# Patient Record
Sex: Female | Born: 1965
Health system: Southern US, Community
[De-identification: ages and names within clinical notes are randomized; demographics above are authoritative.]

## PROBLEM LIST (undated history)

## (undated) ENCOUNTER — Emergency Department (HOSPITAL_COMMUNITY): Payer: Medicaid Other

## (undated) DIAGNOSIS — I10 Essential (primary) hypertension: Secondary | ICD-10-CM

## (undated) DIAGNOSIS — H53131 Sudden visual loss, right eye: Secondary | ICD-10-CM

## (undated) HISTORY — DX: Sudden visual loss, right eye: H53.131

## (undated) HISTORY — PX: ABDOMINAL HYSTERECTOMY: SHX81

---

## 2018-10-29 ENCOUNTER — Encounter (HOSPITAL_COMMUNITY): Payer: Self-pay | Admitting: Emergency Medicine

## 2018-10-29 ENCOUNTER — Other Ambulatory Visit: Payer: Self-pay

## 2018-10-29 ENCOUNTER — Emergency Department (HOSPITAL_COMMUNITY): Payer: Self-pay

## 2018-10-29 ENCOUNTER — Emergency Department (HOSPITAL_COMMUNITY)
Admission: EM | Admit: 2018-10-29 | Discharge: 2018-10-29 | Disposition: A | Discharge status: Home / Self Care | Payer: Self-pay | Attending: Emergency Medicine | Admitting: Emergency Medicine

## 2018-10-29 DIAGNOSIS — Z7982 Long term (current) use of aspirin: Secondary | ICD-10-CM | POA: Insufficient documentation

## 2018-10-29 DIAGNOSIS — I160 Hypertensive urgency: Secondary | ICD-10-CM | POA: Insufficient documentation

## 2018-10-29 DIAGNOSIS — R51 Headache: Secondary | ICD-10-CM | POA: Insufficient documentation

## 2018-10-29 HISTORY — DX: Essential (primary) hypertension: I10

## 2018-10-29 LAB — BASIC METABOLIC PANEL
Anion gap: 9 (ref 5–15)
BUN: 14 mg/dL (ref 6–20)
CO2: 24 mmol/L (ref 22–32)
Calcium: 9 mg/dL (ref 8.9–10.3)
Chloride: 105 mmol/L (ref 98–111)
Creatinine, Ser: 0.6 mg/dL (ref 0.44–1.00)
GFR calc Af Amer: >60 mL/min (ref >60)
GFR calc non Af Amer: >60 mL/min (ref >60)
Glucose, Bld: 94 mg/dL (ref 70–99)
Potassium: 3.8 mmol/L (ref 3.5–5.1)
Sodium: 138 mmol/L (ref 135–145)

## 2018-10-29 LAB — CBC WITH DIFFERENTIAL/PLATELET
Abs Immature Granulocytes: 0.01 10*3/uL (ref 0.00–0.07)
Basophils Absolute: 0 10*3/uL (ref 0.0–0.1)
Basophils Relative: 1 %
Eosinophils Absolute: 0.1 10*3/uL (ref 0.0–0.5)
Eosinophils Relative: 2 %
HCT: 41.7 % (ref 36.0–46.0)
Hemoglobin: 13.8 g/dL (ref 12.0–15.0)
Immature Granulocytes: 0 %
Lymphocytes Relative: 30 %
Lymphs Abs: 1.4 10*3/uL (ref 0.7–4.0)
MCH: 29.2 pg (ref 26.0–34.0)
MCHC: 33.1 g/dL (ref 30.0–36.0)
MCV: 88.3 fL (ref 80.0–100.0)
Monocytes Absolute: 0.5 10*3/uL (ref 0.1–1.0)
Monocytes Relative: 12 %
Neutro Abs: 2.5 10*3/uL (ref 1.7–7.7)
Neutrophils Relative %: 55 %
Platelets: 368 10*3/uL (ref 150–400)
RBC: 4.72 MIL/uL (ref 3.87–5.11)
RDW: 12.4 % (ref 11.5–15.5)
WBC: 4.6 10*3/uL (ref 4.0–10.5)
nRBC: 0 % (ref 0.0–0.2)

## 2018-10-29 LAB — CBG MONITORING, ED: Glucose-Capillary: 95 mg/dL (ref 70–99)

## 2018-10-29 MED ORDER — LABETALOL HCL 5 MG/ML IV SOLN
10.0000 mg | Freq: Once | INTRAVENOUS | Status: AC
  Administered 2018-10-29: 10 mg via INTRAVENOUS

## 2018-10-29 MED ORDER — HYDROCHLOROTHIAZIDE 25 MG PO TABS: 25.0000 mg | ORAL_TABLET | Freq: Every day | ORAL | 0 refills | Status: DC

## 2018-10-29 MED ORDER — LABETALOL HCL 5 MG/ML IV SOLN
10.0000 mg | Freq: Once | INTRAVENOUS | Status: AC
  Administered 2018-10-29: 10 mg via INTRAVENOUS
  Filled 2018-10-29: qty 4

## 2018-10-29 NOTE — ED Provider Notes (Signed)
[facility] DEPT Provider Note   CSN: [id number] Arrival date & time: 10/29/18  1127    History   Chief Complaint Chief Complaint  Patient presents with  . Hypertension    HPI Marie Ferguson is a 53 y.o. female.     HPI   Pt is a 52 y/o female with a h/o HTN (untreated for many years) who presents to the ED today for eval of HTN. States she was seen at the eye doctor 6/24 for eval of decreased vision in the right eye. States that she in unsure how long she has had difficulty with vision in the right eye but estimates that it has been many years. She does note that it seemed to get worse and she has had no vision in the right eye since around March. States that at the eye doctor she was told that she "had popped a blood vessel" in her eye. States she was told it was likely chronic and that there was no intervention that could be done. She was also told that her BP was 198/130 and was advised to go to the ED. She has been unable to do so until today.   Reports being tx for HTN many years ago but does not have insurance and thus has not continued to f/u with a pcp.  Reports chronic daily headaches for at least the last month. Denies any current dizziness, lightheadedness, speech problems, facial droop, difficulty with word finding.  Denies current unilateral numbness/weakness. She has had a few episodes of tingling to the LUE but states that she thinks is due to "sleeping wrong" because it has only happened when she wakes up and goes away throughout the day. Last episode occurred 6/24.  Denies chest pain, shortness of breath, ble swelling. Reports urinary frequency and polydipsia.   Past Medical History:  Diagnosis Date  . Hypertension     There are no active problems to display for this patient.   Past Surgical History:  Procedure Laterality Date  . CESAREAN SECTION       OB History   No obstetric history on file.    Home Medications    Prior  to Admission medications   Medication Sig Start Date End Date Taking? Authorizing Provider  aspirin 325 MG EC tablet Take 650 mg by mouth every 6 (six) hours as needed for pain.   Yes [provider]  hydrochlorothiazide (HYDRODIURIL) 25 MG tablet Take 1 tablet (25 mg total) by mouth daily for 30 days. 10/29/18 11/28/18  Christalyn Goertz S, PA-C    Family History No family history on file.  Social History Social History   Tobacco Use  . Smoking status: Not on file  Substance Use Topics  . Alcohol use: Not on file  . Drug use: Not on file     Allergies   Patient has no known allergies.   Review of Systems Review of Systems  Constitutional: Negative for fever.  HENT: Negative for ear pain and sore throat.   Eyes: Positive for visual disturbance (chronic).  Respiratory: Negative for cough and shortness of breath.   Cardiovascular: Negative for chest pain and leg swelling.  Gastrointestinal: Negative for abdominal pain, constipation, diarrhea, nausea and vomiting.  Genitourinary: Negative for dysuria and hematuria.  Musculoskeletal: Negative for back pain.  Skin: Negative for rash.  Neurological: Positive for headaches. Negative for dizziness, weakness, light-headedness and numbness.  All other systems reviewed and are negative.    Physical Exam Updated  Vital Signs BP (!) 175/108   Pulse 64   Temp 98.7 F (37.1 C) (Oral)   Resp (!) 22   Ht 5\' 3"  (1.6 m)   Wt 84.8 kg   SpO2 99%   BMI 33.13 kg/m   Physical Exam Vitals signs and nursing note reviewed.  Constitutional:      General: She is not in acute distress.    Appearance: She is well-developed.  HENT:     Head: Normocephalic and atraumatic.  Eyes:     Extraocular Movements: Extraocular movements intact.     Conjunctiva/sclera: Conjunctivae normal.     Pupils: Pupils are equal, round, and reactive to light.     Comments: No nystagmus  Neck:     Musculoskeletal: Neck supple.  Cardiovascular:      Rate and Rhythm: Normal rate and regular rhythm.     Pulses: Normal pulses.     Heart sounds: Normal heart sounds. No murmur.  Pulmonary:     Effort: Pulmonary effort is normal. No respiratory distress.     Breath sounds: Normal breath sounds. No wheezing, rhonchi or rales.  Abdominal:     Palpations: Abdomen is soft.     Tenderness: There is no abdominal tenderness.  Musculoskeletal:        General: No tenderness.     Right lower leg: No edema.     Left lower leg: No edema.  Skin:    General: Skin is warm and dry.  Neurological:     Mental Status: She is alert.     Comments: Mental Status:  Alert, thought content appropriate, able to give a coherent history. Speech fluent without evidence of aphasia. Able to follow 2 step commands without difficulty.  Cranial Nerves:  II:  pupils equal, round, reactive to light III,IV, VI: ptosis not present, extra-ocular motions intact bilaterally  V,VII: smile symmetric, facial light touch sensation equal VIII: hearing grossly normal to voice  X: uvula elevates symmetrically  XI: bilateral shoulder shrug symmetric and strong XII: midline tongue extension without fassiculations Motor:  Normal tone. 5/5 strength of BUE and BLE major muscle groups including strong and equal grip strength and dorsiflexion/plantar flexion Sensory: light touch normal in all extremities. Gait: normal gait and balance. CV: 2+ radial and DP pulses      ED Treatments / Results  Labs (all labs ordered are listed, but only abnormal results are displayed) Labs Reviewed  CBC WITH DIFFERENTIAL/PLATELET  BASIC METABOLIC PANEL  CBG MONITORING, ED    EKG EKG Interpretation  Date/Time:  Saturday October 29 2018 12:08:37 EDT Ventricular Rate:  75 PR Interval:    QRS Duration: 82 QT Interval:  399 QTC Calculation: 446 R Axis:   26 Text Interpretation:  Sinus rhythm Left atrial enlargement Left ventricular hypertrophy Baseline wander in lead(s) II aVR aVF Abnormal  ECG Confirmed by Carmin Muskrat [phone]) on 10/29/2018 4:52:08 PM   Radiology Ct Head Wo Contrast  Result Date: 10/29/2018 CLINICAL DATA:  Headache and hypertension.  Right eye blurred vision EXAM: CT HEAD WITHOUT CONTRAST TECHNIQUE: Contiguous axial images were obtained from the base of the skull through the vertex without intravenous contrast. COMPARISON:  None. FINDINGS: Brain: Ventricles are normal in size and configuration. There is no intracranial mass, hemorrhage, extra-axial fluid collection, or midline shift. There is mild small vessel disease in the centra semiovale bilaterally. Elsewhere brain parenchyma appears unremarkable. No acute infarct evident. Vascular: No hyperdense vessel. There are foci of calcification in each carotid siphon region. Skull:  Bony calvarium appears intact. Sinuses/Orbits: Visualized paranasal sinuses are clear. Visualized orbits appear symmetric bilaterally. Other: Visualized mastoid air cells are clear. IMPRESSION: Mild periventricular small vessel disease. No acute infarct evident. No mass or hemorrhage. There are foci of arterial vascular calcification. Electronically Signed   By: Lowella Grip III M.D.   On: 10/29/2018 12:41    Procedures Procedures (including critical care time)  Medications Ordered in ED Medications  labetalol (NORMODYNE) injection 10 mg (10 mg Intravenous Given 10/29/18 1300)  labetalol (NORMODYNE) injection 10 mg (10 mg Intravenous Given 10/29/18 1512)     Initial Impression / Assessment and Plan / ED Course  I have reviewed the triage vital signs and the nursing notes.  Pertinent labs & imaging results that were available during my care of the patient were reviewed by me and considered in my medical decision making (see chart for details).     Final Clinical Impressions(s) / ED Diagnoses   Final diagnoses:  Hypertensive urgency   Pt is a 53 y/o female presenting for eval of HTN. Has h/o of same, untreated for many years.  Had recent visit to eye doctor for decreased vision in the right eye that has been an issue for many years. 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 Suspect old based on pt hx. No chest pain, sob, le swelling, or decreased urine output.   CBG normal CBC nonacute BMP nonacute   EKG Sinus rhythm Left atrial enlargement Left ventricular hypertrophy Baseline wander in lead(s) II aVR aVF Abnormal ECG    CT head with mild periventricular small vessel disease. No acute infarct evident. No mass or hemorrhage. There are foci of arterial vascular calcification.  Patient given IV labetalol in the ED.  Blood pressure improved to the 160s on my final reevaluation.  She is currently asymptomatic.  She has no signs of HTN emergency in the ED. Discussed work-up results and plan for follow-up with [facility] wellness.  Case management was consulted and have attempted to obtain appointment for her.  We will start her on hydrochlorothiazide.  She is in agreement to follow-up closely.  Advised to return to the ER for new or worsening symptoms.  She voices understanding of the plan and reasons to return.  All questions answered.  Patient stable for discharge.  ED Discharge Orders         Ordered    hydrochlorothiazide (HYDRODIURIL) 25 MG tablet  Daily     10/29/18 1619           Rodney Booze, PA-C 10/29/18 1710    Isla Pence, MD 10/30/18 [phone]

## 2018-10-29 NOTE — Progress Notes (Signed)
CSW acknowledging consult for establishing primary care follow up for this patient.  CSW spoke with patient, who reports she is not established with a primary care provider. CSW asked if patient would be agreeable to follow up with [facility] and briefly explained this services available.   Unfortunately, CSW is not able to schedule a primary care appointment for the patient, as it is the weekend. This was communicated to the patient, patient understands and is agreeable to call Monday during business hours to schedule a new patient appointment. Patient has no additional questions or concerns for CSW at this time.   CSW signing off. Please reconsult if additional social work needs arise.  Stephanie Acre, [facility] Social Worker [phone]

## 2018-10-29 NOTE — ED Notes (Signed)
Bed: WA07 Expected date: 10/29/18 Expected time: 12:53 AM Means of arrival:  Comments:

## 2018-10-29 NOTE — Discharge Instructions (Addendum)
You were given a prescription for hydrochlorothiazide.  Please take as directed.  This medication will make you urinate more frequently than normal.  You were given information to follow-up with a  and wellness clinic.  Please call the office to make an appointment soon as possible for reevaluation.  Return to the emergency department for any new or worsening symptoms including any severe headaches, vision changes, numbness/weakness, chest pain or shortness of breath.

## 2018-10-29 NOTE — ED Triage Notes (Signed)
Patient c/o constant headache and hypertension. States seen at eye doctor x4 days ago and told "blood vessel popped in right eye." States she cannot see out of right eye. Denies taking BP meds at this time.

## 2018-11-07 ENCOUNTER — Other Ambulatory Visit: Payer: Self-pay

## 2018-11-07 ENCOUNTER — Encounter: Payer: Self-pay | Admitting: Internal Medicine

## 2018-11-07 ENCOUNTER — Ambulatory Visit: Payer: Self-pay | Attending: Internal Medicine | Admitting: Internal Medicine

## 2018-11-07 VITALS — BP 155/90 | HR 89 | Temp 98.2°F | Resp 18 | Ht 60.0 in | Wt 185.0 lb

## 2018-11-07 DIAGNOSIS — H47211 Primary optic atrophy, right eye: Secondary | ICD-10-CM

## 2018-11-07 DIAGNOSIS — F17210 Nicotine dependence, cigarettes, uncomplicated: Secondary | ICD-10-CM

## 2018-11-07 DIAGNOSIS — N644 Mastodynia: Secondary | ICD-10-CM

## 2018-11-07 DIAGNOSIS — E669 Obesity, unspecified: Secondary | ICD-10-CM

## 2018-11-07 DIAGNOSIS — F17200 Nicotine dependence, unspecified, uncomplicated: Secondary | ICD-10-CM | POA: Insufficient documentation

## 2018-11-07 DIAGNOSIS — I10 Essential (primary) hypertension: Secondary | ICD-10-CM

## 2018-11-07 DIAGNOSIS — N63 Unspecified lump in unspecified breast: Secondary | ICD-10-CM | POA: Insufficient documentation

## 2018-11-07 DIAGNOSIS — H35033 Hypertensive retinopathy, bilateral: Secondary | ICD-10-CM

## 2018-11-07 MED ORDER — HYDROCHLOROTHIAZIDE 25 MG PO TABS: 25.0000 mg | ORAL_TABLET | Freq: Every day | ORAL | 3 refills | Status: DC

## 2018-11-07 MED ORDER — AMLODIPINE BESYLATE 5 MG PO TABS: 5.0000 mg | ORAL_TABLET | Freq: Every day | ORAL | 3 refills | Status: DC

## 2018-11-07 MED FILL — AMLODIPINE BESYLATE 5 MG TA: 5 | 30 days supply | Qty: 30 | Fill #0

## 2018-11-07 MED FILL — HYDROCHLOROTHIAZIDE 25 MG T: 25 | 30 days supply | Qty: 30 | Fill #0

## 2018-11-07 NOTE — Patient Instructions (Signed)
Follow a Healthy Eating Plan - You can do it! Limit sugary drinks.  Avoid sodas, sweet tea, sport or energy drinks, or fruit drinks.  Drink water, lo-fat milk, or diet drinks. Limit snack foods.   Cut back on candy, cake, cookies, chips, ice cream.  These are a special treat, only in small amounts. Eat plenty of vegetables.  Especially dark green, red, and orange vegetables. Aim for at least 3 servings a day. More is better! Include fruit in your daily diet.  Whole fruit is much healthier than fruit juice! Limit "white" bread, "white" pasta, "white" rice.   Choose "100% whole grain" products, brown or wild rice. Avoid fatty meats. Try "Meatless Monday" and choose eggs or beans one day a week.  When eating meat, choose lean meats like chicken, [place], and fish.  Grill, broil, or bake meats instead of frying, and eat poultry without the skin. Eat less salt.  Avoid frozen pizzas, frozen dinners and salty foods.  Use seasonings other than salt in cooking.  This can help blood pressure and keep you from swelling Beer, wine and liquor have calories.  If you can safely drink alcohol, limit to 1 drink per day for women, 2 drinks for men  Start Amlodipine and Hydrochlorothiazide for blood pressure.

## 2018-11-07 NOTE — Progress Notes (Signed)
Patient ID: Marie Ferguson, female    DOB: 1965-07-10  MRN: [id number]  CC: Hospitalization Follow-up (HTN)   Subjective: Marie Ferguson is a 53 y.o. female who presents for new pt visit Her concerns today include:  HTN, hypertensive retinopathy, obesity, tobacco dependence  Lived in [place] 7 yrs.  No PCP due to lack of insurance  Patient referred here after being seen through the ER on 10/29/2018 with elevated blood pressure and headaches.  CT of head showed mild periventricular small vessel disease with no acute infarct, mass or hemorrhage.  She was discharged on HCTZ which she has been taking but has not taken as yet for today.   Patient was referred to the ER from the ophthalmologist Dr. Schuyler Amor whom she had seen a few days prior for vision loss in the right eye.  Patient found to have hypertensive retinopathy bilaterally and atrophy of the optic nerve on the right.  She was also found to be hypertensive.  Hx of HTN but off meds x about 10 yrs  HTN: Patient was having daily HA x 2-3 mths.  Was taking ASA. 2-3 tabs/daily.  Headaches were frontal and associated with some blurred vision. No N/V.  HA dec since being on  HCTZ.   No cp/LE edema/SOB  Patient is obese.  She admits that she eats out a lot because she lives alone and does not do much cooking.  She is not very active.  She works at [employer] on the night shift.  Since her recent diagnosis of vision loss in the right eye, she is taking a 12 weeks leave from her job..   Tob dep:  "Smokes once in a blue moon."  C/o soreness in LT breast x few wks.   No dischg No fhx breast CA Over due for MMG  Family, social, past surgical history is reviewed and updated  Current Outpatient Medications on File Prior to Visit  Medication Sig Dispense Refill  . aspirin 325 MG EC tablet Take 650 mg by mouth every 6 (six) hours as needed for pain.     No current facility-administered medications on file prior to visit.     No Known Allergies   Social History   Socioeconomic History  . Marital status: Single    Spouse name: Not on file  . Number of children: 3  . Years of education: 1 yr community college  . Highest education level: Not on file  Occupational History  . Not on file  Social Needs  . Financial resource strain: Not on file  . Food insecurity    Worry: Not on file    Inability: Not on file  . Transportation needs    Medical: Not on file    Non-medical: Not on file  Tobacco Use  . Smoking status: Current Some Day Smoker    Packs/day: 0.25    Types: Cigarettes  . Smokeless tobacco: Never Used  Substance and Sexual Activity  . Alcohol use: Not Currently  . Drug use: Not Currently  . Sexual activity: Not Currently  Lifestyle  . Physical activity    Days per week: Not on file    Minutes per session: Not on file  . Stress: Not on file  Relationships  . Social Herbalist on phone: Not on file    Gets together: Not on file    Attends religious service: Not on file    Active member of club or organization: Not on file  Attends meetings of clubs or organizations: Not on file    Relationship status: Not on file  . Intimate partner violence    Fear of current or ex partner: Not on file    Emotionally abused: Not on file    Physically abused: Not on file    Forced sexual activity: Not on file  Other Topics Concern  . Not on file  Social History Narrative  . Not on file    Family History  Problem Relation Age of Onset  . Hypertension Mother   . Hypertension Father     Past Surgical History:  Procedure Laterality Date  . CESAREAN SECTION      ROS: Review of Systems Negative except as stated above  PHYSICAL EXAM: BP (!) 155/90 (BP Location: Left Arm, Patient Position: Sitting, Cuff Size: Large)   Pulse 89   Temp 98.2 F (36.8 C) (Oral)   Resp 18   Ht 5' (1.524 m)   Wt 185 lb (83.9 kg)   SpO2 98%   BMI 36.13 kg/m   Physical Exam  General appearance - alert, well  appearing, obese African-American female and in no distress Mental status - normal mood, behavior, speech, dress, motor activity, and thought processes Eyes -pinpoint pupils.  EOMI Nose - normal and patent, no erythema, discharge or polyps Mouth - mucous membranes moist, pharynx normal without lesions.  Several cavities noted including fillings that it fell out Neck - supple, no significant adenopathy Lymphatics -no cervical or axillary lymphadenopathy Chest - clear to auscultation, no wheezes, rales or rhonchi, symmetric air entry Heart - normal rate, regular rhythm, normal S1, S2, no murmurs, rubs, clicks or gallops Breasts -no dimpling seen of the skin.  About a 2 cm soft moderately tender mass felt in the left breast lateral and slightly inferior to the nipple.  No abnormal masses felt in the right breast Extremities - peripheral pulses normal, no pedal edema, no clubbing or cyanosis  CMP Latest Ref Rng & Units 10/29/2018  Glucose 70 - 99 mg/dL 94  BUN 6 - 20 mg/dL 14  Creatinine 0.44 - 1.00 mg/dL 0.60  Sodium 135 - 145 mmol/L 138  Potassium 3.5 - 5.1 mmol/L 3.8  Chloride 98 - 111 mmol/L 105  CO2 22 - 32 mmol/L 24  Calcium 8.9 - 10.3 mg/dL 9.0   Lipid Panel  No results found for: CHOL, TRIG, HDL, CHOLHDL, VLDL, LDLCALC, LDLDIRECT  CBC    Component Value Date/Time   WBC 4.6 10/29/2018 1253   RBC 4.72 10/29/2018 1253   HGB 13.8 10/29/2018 1253   HCT 41.7 10/29/2018 1253   PLT 368 10/29/2018 1253   MCV 88.3 10/29/2018 1253   MCH 29.2 10/29/2018 1253   MCHC 33.1 10/29/2018 1253   RDW 12.4 10/29/2018 1253   LYMPHSABS 1.4 10/29/2018 1253   MONOABS 0.5 10/29/2018 1253   EOSABS 0.1 10/29/2018 1253   BASOSABS 0.0 10/29/2018 1253    ASSESSMENT AND PLAN: 1. Essential hypertension DASH diet discussed and encouraged. Blood pressure goal is 130/80 or lower.  She has not taken HCTZ as yet for today.  Continue HCTZ and add low-dose amlodipine. - Comprehensive metabolic panel -  Lipid panel - amLODipine (NORVASC) 5 MG tablet; Take 1 tablet (5 mg total) by mouth daily.  Dispense: 30 tablet; Refill: 3 - hydrochlorothiazide (HYDRODIURIL) 25 MG tablet; Take 1 tablet (25 mg total) by mouth daily.  Dispense: 30 tablet; Refill: 3  2. Tobacco dependence Patient advised to quit smoking.  She  is a light smoker. Discussed health risks associated with smoking including lung and other types of cancers, chronic lung diseases and CV risks.. Pt ready to give trail of quitting.  Discussed methods to help quit including quitting cold [place], use of NRT, Chantix and Bupropion.  Patient feels she can quit on her own  3. Obesity (BMI 30-39.9) Dietary counseling given.  Advised to eliminate sugary drinks from the diet.  Advised to cut back on white carbohydrates, red meat.  Printed information also provided.  Encouraged her to get in some form of regular aerobic exercise at least 3 to 4 days a week for 30 to 40 minutes.  Initially she can start a trying to do 10 to 15 minutes.  4. Breast mass in female - MM Digital Diagnostic Unilat R; Future  5. Mastalgia See #4 above  6. 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  We will work on getting blood pressure better.  At least 30 minutes spent with patient in direct face-to-face evaluation discussing diagnosis, management and coordinating care Patient advised to apply for the orange card/cone discount so that we can refer her to the dentist on a follow-up visit Patient was given the opportunity to ask questions.  Patient verbalized understanding of the plan and was able to repeat key elements of the plan.   Orders Placed This Encounter  Procedures  . MM Digital Diagnostic Unilat R  . Comprehensive metabolic panel  . Lipid panel     Requested Prescriptions   Signed Prescriptions Disp Refills  . amLODipine (NORVASC) 5 MG tablet 30 tablet 3    Sig: Take 1 tablet (5 mg  total) by mouth daily.  . hydrochlorothiazide (HYDRODIURIL) 25 MG tablet 30 tablet 3    Sig: Take 1 tablet (25 mg total) by mouth daily.    Return in about 1 month (around 12/08/2018) for PAP.  Karle Plumber, MD, FACP

## 2018-11-08 ENCOUNTER — Telehealth: Payer: Self-pay | Admitting: *Deleted

## 2018-11-08 LAB — LIPID PANEL
Chol/HDL Ratio: 4.2 ratio (ref 0.0–4.4)
Cholesterol, Total: 216 mg/dL — ABNORMAL HIGH (ref 100–199)
HDL: 52 mg/dL (ref >39)
LDL Calculated: 134 mg/dL — ABNORMAL HIGH (ref 0–99)
Triglycerides: 152 mg/dL — ABNORMAL HIGH (ref 0–149)
VLDL Cholesterol Cal: 30 mg/dL (ref 5–40)

## 2018-11-08 LAB — COMPREHENSIVE METABOLIC PANEL
ALT: 18 IU/L (ref 0–32)
AST: 18 IU/L (ref 0–40)
Albumin/Globulin Ratio: 1.2 (ref 1.2–2.2)
Albumin: 4.4 g/dL (ref 3.8–4.9)
Alkaline Phosphatase: 134 IU/L — ABNORMAL HIGH (ref 39–117)
BUN/Creatinine Ratio: 22 (ref 9–23)
BUN: 20 mg/dL (ref 6–24)
Bilirubin Total: 0.3 mg/dL (ref 0.0–1.2)
CO2: 27 mmol/L (ref 20–29)
Calcium: 9.5 mg/dL (ref 8.7–10.2)
Chloride: 97 mmol/L (ref 96–106)
Creatinine, Ser: 0.91 mg/dL (ref 0.57–1.00)
GFR calc Af Amer: 83 mL/min/{1.73_m2} (ref >59)
GFR calc non Af Amer: 72 mL/min/{1.73_m2} (ref >59)
Globulin, Total: 3.7 g/dL (ref 1.5–4.5)
Glucose: 89 mg/dL (ref 65–99)
Potassium: 4.8 mmol/L (ref 3.5–5.2)
Sodium: 137 mmol/L (ref 134–144)
Total Protein: 8.1 g/dL (ref 6.0–8.5)

## 2018-11-08 NOTE — Telephone Encounter (Signed)
Patient verified DOB Patient is aware of kidney and liver function being okay and needing to address cholesterol level being elevated. Patient is aware of a recheck being completed in 3 months and possibly starting a medication if the level is still high. No further questions.

## 2018-11-08 NOTE — Telephone Encounter (Signed)
-----   Message from Ladell Pier, MD sent at 11/08/2018  9:18 AM EDT ----- Let patient know that her kidney and liver function tests are okay.  Her LDL cholesterol is 134 with goal being less than 70.  She can get this down through healthy eating and regular exercise as we discussed yesterday.  We can plan to recheck in about 3 months.  If still elevated, I would recommend starting a medication to help lower her cholesterol. The 10-year ASCVD risk score Mikey Bussing DC Brooke Bonito., et al., 2013) is: 19.3%   Values used to calculate the score:     Age: 53 years     Sex: Female     Is Non-Hispanic African American: Yes     Diabetic: No     Tobacco smoker: Yes     Systolic Blood Pressure: 128 mmHg     Is BP treated: Yes     HDL Cholesterol: 52 mg/dL     Total Cholesterol: 216 mg/dL

## 2018-11-09 ENCOUNTER — Telehealth: Payer: Self-pay | Admitting: General Practice

## 2018-11-09 NOTE — Telephone Encounter (Signed)
Medical Assistant left message on patient's home and cell voicemail. Voicemail states to give a call back to [place] with [facility] at [phone]. Patient is aware of MA speaking with [facility] program which is now through the cancer center. A new application will be mailed out.

## 2018-11-09 NOTE — Telephone Encounter (Signed)
Pt gets recording saying that due to covid-19 there not taking scholarship fund..please follow up

## 2018-12-08 ENCOUNTER — Encounter: Payer: Self-pay | Admitting: Internal Medicine

## 2018-12-08 ENCOUNTER — Other Ambulatory Visit: Payer: Self-pay

## 2018-12-08 ENCOUNTER — Ambulatory Visit: Payer: Self-pay | Attending: Internal Medicine | Admitting: Internal Medicine

## 2018-12-08 VITALS — BP 167/114 | HR 95 | Temp 98.7°F | Resp 16 | Wt 191.0 lb

## 2018-12-08 DIAGNOSIS — E669 Obesity, unspecified: Secondary | ICD-10-CM

## 2018-12-08 DIAGNOSIS — I10 Essential (primary) hypertension: Secondary | ICD-10-CM

## 2018-12-08 DIAGNOSIS — N63 Unspecified lump in unspecified breast: Secondary | ICD-10-CM

## 2018-12-08 DIAGNOSIS — E782 Mixed hyperlipidemia: Secondary | ICD-10-CM | POA: Insufficient documentation

## 2018-12-08 DIAGNOSIS — N644 Mastodynia: Secondary | ICD-10-CM

## 2018-12-08 MED FILL — ?HYDROCHLOROTHIAZIDE 25MG T: 25 | 30 days supply | Qty: 30 | Fill #1

## 2018-12-08 MED FILL — ?AMLODIPINE BESYLATE 5MG TA: 5 | 30 days supply | Qty: 30 | Fill #1

## 2018-12-08 NOTE — Progress Notes (Signed)
Pt states she had a full hysterectomy

## 2018-12-08 NOTE — Patient Instructions (Signed)
Please give patient an appointment with the clinical pharmacist in 2 weeks for repeat blood pressure check.   Your blood pressure is not at goal.  Goal is 130/80 or lower.  Make sure that you are taking your medicines every day.  Try to purchase a blood pressure monitoring device so that you can check your blood pressure at home.  If you do get one, I recommend checking your blood pressure at least twice a week.  The goal is 130/80 or lower.

## 2018-12-08 NOTE — Progress Notes (Signed)
Patient ID: Marie Ferguson, female    DOB: [birth year]  MRN: [id number]  CC: Gynecologic Exam   Subjective: Marie Ferguson is a 53 y.o. female who presents for follow-up visit for Pap and recheck blood pressure Her concerns today include:  HTN, hypertensive retinopathy BL with optic disc atrophy on RT (Dr. Schuyler Amor), obesity, tob dep  This visit was for a Pap smear.  However patient reports that she had a total hysterectomy in 1991.  She is not sure why she had hysterectomy No vaginal bleeding or discgh. On last visit she c/o soreness in LT breast.  Referred for MMG but the scholarship program no longer offered.  Would have to get BCCP application today  HYPERTENSION Currently taking: see medication list.  She is on HCTZ and amlodipine.  She has not taken medicines as yet for today. Med Adherence: [x]  Yes but did not take as yet for he morning    Medication side effects: []  Yes    [x]  No Adherence with salt restriction: [x]  Yes    []  No Home Monitoring?: []  Yes    []  No Monitoring Frequency: []  Yes    [x]  No Home BP results range: []  Yes    []  No SOB? []  Yes    [x]  No Chest Pain?: []  Yes    [x]  No Leg swelling?: []  Yes    [x]  No Headaches?: []  Yes    [x]  No Dizziness? []  Yes    [x]  No Comments:    Obesity: Reports that "I have cut back a lot on eating take out." LDL cholesterol was elevated on recent blood test.  I recommend she try to get this down through healthy eating habits and regular exercise.  Patient Active Problem List   Diagnosis Date Noted  . Hyperlipidemia, mixed 12/08/2018  . Hypertensive retinopathy of both eyes 11/07/2018  . Essential hypertension 11/07/2018  . Primary optic atrophy of right eye 11/07/2018  . Tobacco dependence 11/07/2018  . Obesity (BMI 30-39.9) 11/07/2018  . Breast mass in female 11/07/2018     Current Outpatient Medications on File Prior to Visit  Medication Sig Dispense Refill  . amLODipine (NORVASC) 5 MG tablet Take 1 tablet (5 mg total) by  mouth daily. 30 tablet 3  . aspirin 325 MG EC tablet Take 650 mg by mouth every 6 (six) hours as needed for pain.    . hydrochlorothiazide (HYDRODIURIL) 25 MG tablet Take 1 tablet (25 mg total) by mouth daily. 30 tablet 3   No current facility-administered medications on file prior to visit.     No Known Allergies  Social History   Socioeconomic History  . Marital status: Single    Spouse name: Not on file  . Number of children: 3  . Years of education: 1 yr community college  . Highest education level: Not on file  Occupational History  . Not on file  Social Needs  . Financial resource strain: Not on file  . Food insecurity    Worry: Not on file    Inability: Not on file  . Transportation needs    Medical: Not on file    Non-medical: Not on file  Tobacco Use  . Smoking status: Current Some Day Smoker    Packs/day: 0.25    Types: Cigarettes  . Smokeless tobacco: Never Used  Substance and Sexual Activity  . Alcohol use: Not Currently  . Drug use: Not Currently  . Sexual activity: Not Currently  Lifestyle  . Physical activity  Days per week: Not on file    Minutes per session: Not on file  . Stress: Not on file  Relationships  . Social Herbalist on phone: Not on file    Gets together: Not on file    Attends religious service: Not on file    Active member of club or organization: Not on file    Attends meetings of clubs or organizations: Not on file    Relationship status: Not on file  . Intimate partner violence    Fear of current or ex partner: Not on file    Emotionally abused: Not on file    Physically abused: Not on file    Forced sexual activity: Not on file  Other Topics Concern  . Not on file  Social History Narrative  . Not on file    Family History  Problem Relation Age of Onset  . Hypertension Mother   . Hypertension Father     Past Surgical History:  Procedure Laterality Date  . CESAREAN SECTION      ROS: Review of Systems  Negative except as stated above  PHYSICAL EXAM: BP (!) 167/114   Pulse 95   Temp 98.7 F (37.1 C) (Oral)   Resp 16   Wt 191 lb (86.6 kg)   SpO2 95%   BMI 37.30 kg/m   BP 160/120 Physical Exam General appearance - alert, well appearing, and in no distress Mental status - normal mood, behavior, speech, dress, motor activity, and thought processes Chest - clear to auscultation, no wheezes, rales or rhonchi, symmetric air entry Heart - normal rate, regular rhythm, normal S1, S2, no murmurs, rubs, clicks or gallops Extremities - peripheral pulses normal, no pedal edema, no clubbing or cyanosis Pelvic exam: My CMA Sallyanne Havers is present.  Patient does not have a cervix.  CMP Latest Ref Rng & Units 11/07/2018 10/29/2018  Glucose 65 - 99 mg/dL 89 94  BUN 6 - 24 mg/dL 20 14  Creatinine 0.57 - 1.00 mg/dL 0.91 0.60  Sodium 134 - 144 mmol/L 137 138  Potassium 3.5 - 5.2 mmol/L 4.8 3.8  Chloride 96 - 106 mmol/L 97 105  CO2 20 - 29 mmol/L 27 24  Calcium 8.7 - 10.2 mg/dL 9.5 9.0  Total Protein 6.0 - 8.5 g/dL 8.1 -  Total Bilirubin 0.0 - 1.2 mg/dL 0.3 -  Alkaline Phos 39 - 117 IU/L 134(H) -  AST 0 - 40 IU/L 18 -  ALT 0 - 32 IU/L 18 -   Lipid Panel     Component Value Date/Time   CHOL 216 (H) 11/07/2018 1333   TRIG 152 (H) 11/07/2018 1333   HDL 52 11/07/2018 1333   CHOLHDL 4.2 11/07/2018 1333   LDLCALC 134 (H) 11/07/2018 1333    CBC    Component Value Date/Time   WBC 4.6 10/29/2018 1253   RBC 4.72 10/29/2018 1253   HGB 13.8 10/29/2018 1253   HCT 41.7 10/29/2018 1253   PLT 368 10/29/2018 1253   MCV 88.3 10/29/2018 1253   MCH 29.2 10/29/2018 1253   MCHC 33.1 10/29/2018 1253   RDW 12.4 10/29/2018 1253   LYMPHSABS 1.4 10/29/2018 1253   MONOABS 0.5 10/29/2018 1253   EOSABS 0.1 10/29/2018 1253   BASOSABS 0.0 10/29/2018 1253    ASSESSMENT AND PLAN: 1. Essential hypertension Not at goal.  Patient to take her medicines when she returns home.  Follow-up with clinical pharmacist in 2  weeks for repeat blood pressure check.  Advised to take medicines before coming to that visit.  Continue to limit salt in the foods.  2. Obesity (BMI 30-39.9) Commended her on cutting back on portion sizes.  Dietary counseling given.  Encouraged her to get in some form of moderate intensity exercise at least 3 to 4 days a week for 30 minutes  3. Mastalgia 4. Breast mass in female My CMA gave her the form for BCCP so that she can apply and get her mammogram done.  5.  Hyperlipidemia Patient to work on improving eating habits and getting in some regular exercise.  We will plan to recheck cholesterol again in several months  Patient was given the opportunity to ask questions.  Patient verbalized understanding of the plan and was able to repeat key elements of the plan.   No orders of the defined types were placed in this encounter.    Requested Prescriptions    No prescriptions requested or ordered in this encounter    Return in about 3 months (around 03/10/2019).  Karle Plumber, MD, FACP

## 2018-12-19 ENCOUNTER — Telehealth (HOSPITAL_COMMUNITY): Payer: Self-pay

## 2018-12-19 NOTE — Telephone Encounter (Signed)
Telephoned patient to schedule appointment with [facility]. Left message with info for patient to return call.

## 2018-12-20 ENCOUNTER — Other Ambulatory Visit (HOSPITAL_COMMUNITY): Payer: Self-pay | Admitting: *Deleted

## 2018-12-20 DIAGNOSIS — N644 Mastodynia: Secondary | ICD-10-CM

## 2018-12-22 ENCOUNTER — Ambulatory Visit: Payer: Self-pay | Attending: Family Medicine | Admitting: Pharmacist

## 2018-12-22 ENCOUNTER — Encounter: Payer: Self-pay | Admitting: Pharmacist

## 2018-12-22 ENCOUNTER — Other Ambulatory Visit: Payer: Self-pay

## 2018-12-22 VITALS — BP 142/82 | HR 78

## 2018-12-22 DIAGNOSIS — I10 Essential (primary) hypertension: Secondary | ICD-10-CM

## 2018-12-22 NOTE — Progress Notes (Signed)
   S: PCP: Dr. Wynetta Emery     Patient arrives in good spirits. Presents to the clinic for hypertension evaluation, counseling, and management.  Patient was referred and last seen by Primary Care Provider on 12/08/18 - BP was 167/114.   Patient reports adherence with medications. Patent reports occassional headache (2x/week).  Current BP Medications include:  Amlodipine 5 mg daily, HCTZ 25 mg daily (did not take this AM as instructed)  Dietary habits include: non-compliant with salt restriction. Denies caffeine intake. Exercise habits include:Denies Family / Social history: - HTN (mother, father) - Tobacco: 0.25 PPD - Denies alcohol intake since last PCP encounter  O:   Home BP readings: does not have a machine to check, will purchase one today.  Last 3 Office BP readings: BP Readings from Last 3 Encounters:  12/22/18 (!) 142/82  12/08/18 (!) 167/114  11/07/18 (!) 155/90   BMET    Component Value Date/Time   NA 137 11/07/2018 1333   K 4.8 11/07/2018 1333   CL 97 11/07/2018 1333   CO2 27 11/07/2018 1333   GLUCOSE 89 11/07/2018 1333   GLUCOSE 94 10/29/2018 1253   BUN 20 11/07/2018 1333   CREATININE 0.91 11/07/2018 1333   CALCIUM 9.5 11/07/2018 1333   GFRNONAA 72 11/07/2018 1333   GFRAA 83 11/07/2018 1333   Renal function: CrCl cannot be calculated (Patient's most recent lab result is older than the maximum 21 days allowed.).  Clinical ASCVD: No  The 10-year ASCVD risk score Mikey Bussing DC Jr., et al., 2013) is: 14.5%   Values used to calculate the score:     Age: 53 years     Sex: Female     Is Non-Hispanic African American: Yes     Diabetic: No     Tobacco smoker: Yes     Systolic Blood Pressure: 458 mmHg     Is BP treated: Yes     HDL Cholesterol: 52 mg/dL     Total Cholesterol: 216 mg/dL   A/P: Hypertension longstanding currently uncontrolled on current medications. BP Goal = <130/80 mmHg. Patient is adherent with current medications but did not take this AM. She is  going to obtain a BP cuff for home use at the conclusion of this appointment. Will have her check home BP and follow-up with me in 1 week. I have emphasized that she should take her medications before seeing me next week. Will make no changes at this time.  -Continued current regimen. -Counseled on lifestyle modifications for blood pressure control including reduced dietary sodium, increased exercise, adequate sleep  Results reviewed and written information provided.Total time in face-to-face counseling 15 minutes.   F/U Clinic Visit in 1 week.   Benard Halsted, PharmD, [place] [phone]

## 2018-12-22 NOTE — Patient Instructions (Signed)
Thank you for coming to see us today.   Blood pressure today is improving.  Continue taking blood pressure medications as prescribed.   Limiting salt and caffeine, as well as exercising as able for at least 30 minutes for 5 days out of the week, can also help you lower your blood pressure.  Take your blood pressure at home if you are able. Please write down these numbers and bring them to your visits.  If you have any questions about medications, please call me [phone].  Luke  

## 2018-12-29 ENCOUNTER — Other Ambulatory Visit (HOSPITAL_COMMUNITY): Payer: Self-pay | Admitting: Obstetrics and Gynecology

## 2018-12-29 ENCOUNTER — Ambulatory Visit
Admission: RE | Admit: 2018-12-29 | Discharge: 2018-12-29 | Disposition: A | Discharge status: Home / Self Care | Payer: No Typology Code available for payment source | Source: Ambulatory Visit | Attending: Obstetrics and Gynecology | Admitting: Obstetrics and Gynecology

## 2018-12-29 ENCOUNTER — Encounter (HOSPITAL_COMMUNITY): Payer: Self-pay

## 2018-12-29 ENCOUNTER — Other Ambulatory Visit: Payer: Self-pay

## 2018-12-29 ENCOUNTER — Encounter: Payer: Self-pay | Admitting: Pharmacist

## 2018-12-29 ENCOUNTER — Ambulatory Visit: Payer: Self-pay | Attending: Internal Medicine | Admitting: Pharmacist

## 2018-12-29 ENCOUNTER — Ambulatory Visit (HOSPITAL_COMMUNITY)
Admission: RE | Admit: 2018-12-29 | Discharge: 2018-12-29 | Disposition: A | Discharge status: Home / Self Care | Payer: Self-pay | Source: Ambulatory Visit | Attending: Obstetrics and Gynecology | Admitting: Obstetrics and Gynecology

## 2018-12-29 VITALS — BP 128/82 | HR 99

## 2018-12-29 DIAGNOSIS — N6320 Unspecified lump in the left breast, unspecified quadrant: Secondary | ICD-10-CM

## 2018-12-29 DIAGNOSIS — N644 Mastodynia: Secondary | ICD-10-CM

## 2018-12-29 DIAGNOSIS — I10 Essential (primary) hypertension: Secondary | ICD-10-CM

## 2018-12-29 DIAGNOSIS — R921 Mammographic calcification found on diagnostic imaging of breast: Secondary | ICD-10-CM

## 2018-12-29 DIAGNOSIS — Z1239 Encounter for other screening for malignant neoplasm of breast: Secondary | ICD-10-CM | POA: Insufficient documentation

## 2018-12-29 MED ORDER — AMLODIPINE BESYLATE 10 MG PO TABS: 10.0000 mg | ORAL_TABLET | Freq: Every day | ORAL | 2 refills | Status: DC

## 2018-12-29 MED FILL — ?AMLODIPINE BESYLATE 10 MG: 10 | 30 days supply | Qty: 30 | Fill #0

## 2018-12-29 NOTE — Addendum Note (Signed)
Encounter addended by: Loletta Parish, RN on: 12/29/2018 7:50 PM  Actions taken: Clinical Note Signed

## 2018-12-29 NOTE — Progress Notes (Signed)
   S: PCP: Dr. Wynetta Emery     Patient arrives in good spirits. Presents to the clinic for hypertension evaluation, counseling, and management. Patient was referred and last seen by Primary Care Provider on 12/22/18 - BP was 142/82.   Patient reports adherence with medications. Patent reports occassional headache (2x/week).  Current BP Medications include:  Amlodipine 5 mg daily, HCTZ 25 mg daily (took both this AM)  Dietary habits include: replaced salt with Ms. Deliah Boston. Denies caffeine intake. Exercise habits include: denies Family / Social history: - HTN (mother, father) - Tobacco: 0.25 PPD - Denies alcohol intake since last PCP encounter  O:    Home BP readings: Checking and brings in log that reveals the following: Monday: 125/91 Tuesday: 155/103 Wed: 157/103 Thursday: 154/111 She reports not resting as instructed before taking home pressure  Last 3 Office BP readings: BP Readings from Last 3 Encounters:  12/29/18 (!) 148/88  12/29/18 128/82  12/22/18 (!) 142/82   BMET    Component Value Date/Time   NA 137 11/07/2018 1333   K 4.8 11/07/2018 1333   CL 97 11/07/2018 1333   CO2 27 11/07/2018 1333   GLUCOSE 89 11/07/2018 1333   GLUCOSE 94 10/29/2018 1253   BUN 20 11/07/2018 1333   CREATININE 0.91 11/07/2018 1333   CALCIUM 9.5 11/07/2018 1333   GFRNONAA 72 11/07/2018 1333   GFRAA 83 11/07/2018 1333   Renal function: CrCl cannot be calculated (Patient's most recent lab result is older than the maximum 21 days allowed.).  Clinical ASCVD: No  The 10-year ASCVD risk score Mikey Bussing DC Jr., et al., 2013) is: 16.6%   Values used to calculate the score:     Age: 53 years     Sex: Female     Is Non-Hispanic African American: Yes     Diabetic: No     Tobacco smoker: Yes     Systolic Blood Pressure: 123456 mmHg     Is BP treated: Yes     HDL Cholesterol: 52 mg/dL     Total Cholesterol: 216 mg/dL  A/P: Hypertension longstanding currently uncontrolled on current medications. BP  Goal = <130/80 mmHg. Patient is adherent with current medications and took medications this morning.  -Increase amlodipine to 10 mg daily -Counseled on lifestyle modifications for blood pressure control including reduced dietary sodium, increased exercise, adequate sleep -HM: Fluarix given; will address Adacel at next visit -ASCVD risk: score decrease with improved BP control. Will assess need for statin at follow-up visit.   Results reviewed and written information provided.Total time in face-to-face counseling 15 minutes.   F/U Clinic Visit in 1 month.   Benard Halsted, PharmD, [place] [phone]

## 2018-12-29 NOTE — Progress Notes (Addendum)
Complaints of left lower breast pain near nipple x 2 years. Patient states the pain comes and goes. Patient rates the pain at a 5-6 out of 10.  Pap Smear: Pap smear not completed today. Last Pap smear was over 10 years ago and normal per patient. Per patient has no history of an abnormal Pap smear. Patient has a history of a hysterectomy in 2012 that per patient is unsure why. Patient stated she did not have the hysterectomy due to cancer and was for benign reasons. Patient doesn't need any further Pap smears due to her history of a hysterectomy for benign reasons per BCCCP and ACOG guidelines. No Pap smear results are in Epic.  Physical exam: Breasts Breasts symmetrical. No skin abnormalities bilateral breasts. No nipple retraction bilateral breasts. No nipple discharge bilateral breasts. No lymphadenopathy. No lumps palpated bilateral breasts. Complaints of left lower breast pain at 6 o'clock on exam. Referred patient to the [facility] for a diagnostic mammogram. Appointment scheduled for Thursday, December 29, 2018 at 1350.       Pelvic/Bimanual No Pap smear completed today since patient has a history of a hysterectomy for benign reasons. Pap smear not indicated per BCCCP guidelines.   Smoking History: Patient is a current smoker. Discussed smoking cessation with patient. Referred to the [facility] Quitline and gave resources to the free smoking cessation classes at [facility].  Patient Navigation: Patient education provided. Access to services provided for patient through [facility] program. Uber/Lyft provided for patient to get to [place] appointment and home.  Colorectal Cancer Screening: Per patient has never had a colonoscopy completed. No complaints today.   Breast and Cervical Cancer Risk Assessment: Patient has no family history of breast cancer, known genetic mutations, or radiation treatment to the chest before age 33. Patient has no history of cervical dysplasia,  immunocompromised, or DES exposure in-utero.  Risk Assessment    Risk Scores      12/29/2018   Last edited by: Armond Hang, LPN   5-year risk: 1.3 %   Lifetime risk: 8 %

## 2018-12-29 NOTE — Patient Instructions (Signed)
Thank you for coming to see [place] today.   Blood pressure today is still above goal.  Start taking amlodipine 10 mg daily.  Limiting salt and caffeine, as well as exercising as able for at least 30 minutes for 5 days out of the week, can also help you lower your blood pressure.  Take your blood pressure at home if you are able. Please write down these numbers and bring them to your visits.  If you have any questions about medications, please call me [phone].  Lurena Joiner

## 2018-12-29 NOTE — Patient Instructions (Signed)
Explained breast self awareness with Richelle Ito. Patient did not need a Pap smear today due to patient has a history of a hysterectomy for benign reasons. Let patient know that she doesn't need any further Pap smears due to her history of a hysterectomy for benign reasons. Referred patient to the [facility] for a diagnostic mammogram. Appointment scheduled for Thursday, December 29, 2018 at 1350. Patient aware of appointment and will be there. Discussed smoking cessation with patient. Referred to the [facility] Quitline and gave resources to the free smoking cessation classes at [facility]. Richelle Ito verbalized understanding.  Herley Bernardini, Arvil Chaco, RN 12:18 PM

## 2019-01-05 ENCOUNTER — Ambulatory Visit
Admission: RE | Admit: 2019-01-05 | Discharge: 2019-01-05 | Disposition: A | Discharge status: Home / Self Care | Payer: No Typology Code available for payment source | Source: Ambulatory Visit | Attending: Obstetrics and Gynecology | Admitting: Obstetrics and Gynecology

## 2019-01-05 ENCOUNTER — Other Ambulatory Visit: Payer: Self-pay

## 2019-01-05 DIAGNOSIS — N6320 Unspecified lump in the left breast, unspecified quadrant: Secondary | ICD-10-CM

## 2019-01-05 DIAGNOSIS — R921 Mammographic calcification found on diagnostic imaging of breast: Secondary | ICD-10-CM

## 2019-01-06 ENCOUNTER — Other Ambulatory Visit: Payer: Self-pay | Admitting: Obstetrics and Gynecology

## 2019-01-06 DIAGNOSIS — R921 Mammographic calcification found on diagnostic imaging of breast: Secondary | ICD-10-CM

## 2019-01-13 ENCOUNTER — Other Ambulatory Visit: Payer: Self-pay | Admitting: General Practice

## 2019-01-13 ENCOUNTER — Ambulatory Visit
Admission: RE | Admit: 2019-01-13 | Discharge: 2019-01-13 | Disposition: A | Discharge status: Home / Self Care | Payer: No Typology Code available for payment source | Source: Ambulatory Visit | Attending: Obstetrics and Gynecology | Admitting: Obstetrics and Gynecology

## 2019-01-13 ENCOUNTER — Other Ambulatory Visit: Payer: Self-pay

## 2019-01-13 DIAGNOSIS — R921 Mammographic calcification found on diagnostic imaging of breast: Secondary | ICD-10-CM

## 2019-01-16 ENCOUNTER — Encounter: Payer: Self-pay | Admitting: *Deleted

## 2019-01-18 MED FILL — ?HYDROCHLOROTHIAZIDE 25MG T: 25 | 30 days supply | Qty: 30 | Fill #2

## 2019-01-20 ENCOUNTER — Ambulatory Visit: Payer: Self-pay | Admitting: General Surgery

## 2019-01-20 DIAGNOSIS — N6022 Fibroadenosis of left breast: Secondary | ICD-10-CM

## 2019-01-27 ENCOUNTER — Other Ambulatory Visit: Payer: Self-pay | Admitting: General Surgery

## 2019-01-27 DIAGNOSIS — N6022 Fibroadenosis of left breast: Secondary | ICD-10-CM

## 2019-01-30 ENCOUNTER — Ambulatory Visit: Payer: Self-pay | Admitting: Pharmacist

## 2019-02-02 MED FILL — ?AMLODIPINE BESYLATE 10 MG: 10 | 30 days supply | Qty: 30 | Fill #1

## 2019-02-03 ENCOUNTER — Other Ambulatory Visit: Payer: Self-pay

## 2019-02-03 ENCOUNTER — Ambulatory Visit: Payer: Self-pay | Attending: Internal Medicine | Admitting: Pharmacist

## 2019-02-03 ENCOUNTER — Encounter: Payer: Self-pay | Admitting: Pharmacist

## 2019-02-03 VITALS — BP 121/82 | HR 85

## 2019-02-03 DIAGNOSIS — I10 Essential (primary) hypertension: Secondary | ICD-10-CM

## 2019-02-03 NOTE — Patient Instructions (Signed)

## 2019-02-03 NOTE — Progress Notes (Signed)
   S: PCP: Dr. Wynetta Emery     Patient arrives in good spirits. Presents to the clinic for hypertension evaluation, counseling, and management. Patient was referred and last seen by Primary Care Provider on 12/22/18 - BP was 142/82. I saw her on 8/27 and her BP was 128/82.   Patient reports adherence with medications. Patent reports occassional headache (2x/week). She reports increased stress/anxiety from issues with her daughter and upcoming surgery.   Current BP Medications include:  Amlodipine 10 mg daily, HCTZ 25 mg daily (took both this AM)  Dietary habits include: replaced salt with Ms. Deliah Boston. Denies caffeine intake. Exercise habits include: denies Family / Social history: - HTN (mother, father) - Tobacco: 0.25 PPD - Denies alcohol intake since last PCP encounter  O:    Last 3 Office BP readings: BP Readings from Last 3 Encounters:  02/03/19 121/82  12/29/18 (!) 148/88  12/29/18 128/82   BMET    Component Value Date/Time   NA 137 11/07/2018 1333   K 4.8 11/07/2018 1333   CL 97 11/07/2018 1333   CO2 27 11/07/2018 1333   GLUCOSE 89 11/07/2018 1333   GLUCOSE 94 10/29/2018 1253   BUN 20 11/07/2018 1333   CREATININE 0.91 11/07/2018 1333   CALCIUM 9.5 11/07/2018 1333   GFRNONAA 72 11/07/2018 1333   GFRAA 83 11/07/2018 1333   Renal function: CrCl cannot be calculated (Patient's most recent lab result is older than the maximum 21 days allowed.).  Clinical ASCVD: No  The 10-year ASCVD risk score Mikey Bussing DC Jr., et al., 2013) is: 8.4%   Values used to calculate the score:     Age: 53 years     Sex: Female     Is Non-Hispanic African American: Yes     Diabetic: No     Tobacco smoker: Yes     Systolic Blood Pressure: 123XX123 mmHg     Is BP treated: Yes     HDL Cholesterol: 52 mg/dL     Total Cholesterol: 216 mg/dL  A/P: Hypertension longstanding currently close to goalon current medications. BP Goal = <130/80 mmHg. Patient is adherent with current medications and took  medications this morning.  -Continue amlodipine 10 mg, HCTZ 25 mg daily -Counseled on lifestyle modifications for blood pressure control including reduced dietary sodium, increased exercise, adequate sleep -Encouraged her to schedule appt with [facility]   Results reviewed and written information provided.Total time in face-to-face counseling 15 minutes.   F/U Clinic Visit in 1 month.   Benard Halsted, PharmD, [place] [phone]

## 2019-02-06 ENCOUNTER — Ambulatory Visit: Payer: Self-pay | Admitting: Pharmacist

## 2019-02-17 MED FILL — ?HYDROCHLOROTHIAZIDE 25MG T: 25 | 30 days supply | Qty: 30 | Fill #3

## 2019-02-22 ENCOUNTER — Ambulatory Visit: Payer: No Typology Code available for payment source | Admitting: Licensed Clinical Social Worker

## 2019-02-24 ENCOUNTER — Other Ambulatory Visit: Payer: Self-pay

## 2019-02-24 ENCOUNTER — Encounter (HOSPITAL_BASED_OUTPATIENT_CLINIC_OR_DEPARTMENT_OTHER): Payer: Self-pay

## 2019-02-28 ENCOUNTER — Encounter (HOSPITAL_BASED_OUTPATIENT_CLINIC_OR_DEPARTMENT_OTHER)
Admission: RE | Admit: 2019-02-28 | Discharge: 2019-02-28 | Disposition: A | Discharge status: Home / Self Care | Payer: No Typology Code available for payment source | Source: Ambulatory Visit | Attending: General Surgery | Admitting: General Surgery

## 2019-02-28 ENCOUNTER — Other Ambulatory Visit (HOSPITAL_COMMUNITY)
Admission: RE | Admit: 2019-02-28 | Discharge: 2019-02-28 | Disposition: A | Discharge status: Home / Self Care | Payer: No Typology Code available for payment source | Source: Ambulatory Visit | Attending: General Surgery | Admitting: General Surgery

## 2019-02-28 DIAGNOSIS — Z01812 Encounter for preprocedural laboratory examination: Secondary | ICD-10-CM | POA: Insufficient documentation

## 2019-02-28 DIAGNOSIS — Z20828 Contact with and (suspected) exposure to other viral communicable diseases: Secondary | ICD-10-CM | POA: Insufficient documentation

## 2019-02-28 DIAGNOSIS — Z01818 Encounter for other preprocedural examination: Secondary | ICD-10-CM | POA: Insufficient documentation

## 2019-02-28 LAB — BASIC METABOLIC PANEL
Anion gap: 11 (ref 5–15)
BUN: 11 mg/dL (ref 6–20)
CO2: 25 mmol/L (ref 22–32)
Calcium: 9.5 mg/dL (ref 8.9–10.3)
Chloride: 104 mmol/L (ref 98–111)
Creatinine, Ser: 0.78 mg/dL (ref 0.44–1.00)
GFR calc Af Amer: >60 mL/min (ref >60)
GFR calc non Af Amer: >60 mL/min (ref >60)
Glucose, Bld: 84 mg/dL (ref 70–99)
Potassium: 4.3 mmol/L (ref 3.5–5.1)
Sodium: 140 mmol/L (ref 135–145)

## 2019-02-28 LAB — SARS CORONAVIRUS 2 (TAT 6-24 HRS): SARS Coronavirus 2: NEGATIVE

## 2019-02-28 MED ORDER — CHLORHEXIDINE GLUCONATE CLOTH 2 % EX PADS: 6.0000 | MEDICATED_PAD | Freq: Once | CUTANEOUS | Status: DC

## 2019-02-28 NOTE — Progress Notes (Signed)

## 2019-03-02 ENCOUNTER — Ambulatory Visit
Admission: RE | Admit: 2019-03-02 | Discharge: 2019-03-02 | Disposition: A | Discharge status: Home / Self Care | Payer: No Typology Code available for payment source | Source: Ambulatory Visit | Attending: General Surgery | Admitting: General Surgery

## 2019-03-02 ENCOUNTER — Other Ambulatory Visit: Payer: Self-pay

## 2019-03-02 DIAGNOSIS — N6022 Fibroadenosis of left breast: Secondary | ICD-10-CM

## 2019-03-03 ENCOUNTER — Other Ambulatory Visit: Payer: Self-pay

## 2019-03-03 ENCOUNTER — Ambulatory Visit
Admission: RE | Admit: 2019-03-03 | Discharge: 2019-03-03 | Disposition: A | Discharge status: Home / Self Care | Payer: No Typology Code available for payment source | Source: Ambulatory Visit | Attending: General Surgery | Admitting: General Surgery

## 2019-03-03 ENCOUNTER — Ambulatory Visit (HOSPITAL_BASED_OUTPATIENT_CLINIC_OR_DEPARTMENT_OTHER): Payer: No Typology Code available for payment source | Admitting: Certified Registered"

## 2019-03-03 ENCOUNTER — Ambulatory Visit (HOSPITAL_BASED_OUTPATIENT_CLINIC_OR_DEPARTMENT_OTHER)
Admission: RE | Admit: 2019-03-03 | Discharge: 2019-03-03 | Disposition: A | Discharge status: Home / Self Care | Payer: No Typology Code available for payment source | Attending: General Surgery | Admitting: General Surgery

## 2019-03-03 ENCOUNTER — Encounter (HOSPITAL_BASED_OUTPATIENT_CLINIC_OR_DEPARTMENT_OTHER)
Admission: RE | Disposition: A | Discharge status: Home / Self Care | Payer: Self-pay | Source: Home / Self Care | Attending: General Surgery

## 2019-03-03 ENCOUNTER — Encounter (HOSPITAL_BASED_OUTPATIENT_CLINIC_OR_DEPARTMENT_OTHER): Payer: Self-pay | Admitting: Certified Registered"

## 2019-03-03 DIAGNOSIS — F17200 Nicotine dependence, unspecified, uncomplicated: Secondary | ICD-10-CM | POA: Insufficient documentation

## 2019-03-03 DIAGNOSIS — N6022 Fibroadenosis of left breast: Secondary | ICD-10-CM

## 2019-03-03 DIAGNOSIS — N6489 Other specified disorders of breast: Secondary | ICD-10-CM | POA: Insufficient documentation

## 2019-03-03 DIAGNOSIS — D242 Benign neoplasm of left breast: Secondary | ICD-10-CM | POA: Insufficient documentation

## 2019-03-03 DIAGNOSIS — Z79899 Other long term (current) drug therapy: Secondary | ICD-10-CM | POA: Insufficient documentation

## 2019-03-03 DIAGNOSIS — I10 Essential (primary) hypertension: Secondary | ICD-10-CM | POA: Insufficient documentation

## 2019-03-03 HISTORY — PX: BREAST LUMPECTOMY WITH RADIOACTIVE SEED LOCALIZATION: SHX6424

## 2019-03-03 SURGERY — BREAST LUMPECTOMY WITH RADIOACTIVE SEED LOCALIZATION
Anesthesia: General | Site: Breast | Laterality: Left

## 2019-03-03 MED ORDER — LIDOCAINE HCL (CARDIAC) PF 100 MG/5ML IV SOSY
PREFILLED_SYRINGE | INTRAVENOUS | Status: DC | PRN
  Administered 2019-03-03: 60 mg via INTRAVENOUS

## 2019-03-03 MED ORDER — FENTANYL CITRATE (PF) 100 MCG/2ML IJ SOLN: 25.0000 ug | INTRAMUSCULAR | Status: DC | PRN

## 2019-03-03 MED ORDER — ACETAMINOPHEN 500 MG PO TABS
ORAL_TABLET | ORAL | Status: AC
  Filled 2019-03-03: qty 2

## 2019-03-03 MED ORDER — HYDROCODONE-ACETAMINOPHEN 5-325 MG PO TABS: 1.0000 | ORAL_TABLET | Freq: Four times a day (QID) | ORAL | 0 refills | Status: DC | PRN

## 2019-03-03 MED ORDER — FENTANYL CITRATE (PF) 100 MCG/2ML IJ SOLN
INTRAMUSCULAR | Status: AC
  Filled 2019-03-03: qty 2

## 2019-03-03 MED ORDER — CELECOXIB 200 MG PO CAPS
ORAL_CAPSULE | ORAL | Status: AC
  Filled 2019-03-03: qty 1

## 2019-03-03 MED ORDER — CEFAZOLIN SODIUM-DEXTROSE 2-4 GM/100ML-% IV SOLN
INTRAVENOUS | Status: AC
  Filled 2019-03-03: qty 100

## 2019-03-03 MED ORDER — CELECOXIB 200 MG PO CAPS
200.0000 mg | ORAL_CAPSULE | ORAL | Status: AC
  Administered 2019-03-03: 200 mg via ORAL

## 2019-03-03 MED ORDER — PROPOFOL 500 MG/50ML IV EMUL
INTRAVENOUS | Status: DC | PRN
  Administered 2019-03-03: 25 ug/kg/min via INTRAVENOUS

## 2019-03-03 MED ORDER — LACTATED RINGERS IV SOLN
INTRAVENOUS | Status: DC
  Administered 2019-03-03: 09:00:00 via INTRAVENOUS

## 2019-03-03 MED ORDER — ONDANSETRON HCL 4 MG/2ML IJ SOLN
INTRAMUSCULAR | Status: DC | PRN
  Administered 2019-03-03: 4 mg via INTRAVENOUS

## 2019-03-03 MED ORDER — CEFAZOLIN SODIUM-DEXTROSE 2-4 GM/100ML-% IV SOLN
2.0000 g | INTRAVENOUS | Status: AC
  Administered 2019-03-03: 2 g via INTRAVENOUS

## 2019-03-03 MED ORDER — FENTANYL CITRATE (PF) 100 MCG/2ML IJ SOLN
50.0000 ug | INTRAMUSCULAR | Status: DC | PRN
  Administered 2019-03-03: 25 ug via INTRAVENOUS
  Administered 2019-03-03: 100 ug via INTRAVENOUS

## 2019-03-03 MED ORDER — 0.9 % SODIUM CHLORIDE (POUR BTL) OPTIME
TOPICAL | Status: DC | PRN
  Administered 2019-03-03: 300 mL

## 2019-03-03 MED ORDER — ACETAMINOPHEN 500 MG PO TABS
1000.0000 mg | ORAL_TABLET | ORAL | Status: AC
  Administered 2019-03-03: 1000 mg via ORAL

## 2019-03-03 MED ORDER — PROPOFOL 10 MG/ML IV BOLUS
INTRAVENOUS | Status: DC | PRN
  Administered 2019-03-03: 200 mg via INTRAVENOUS

## 2019-03-03 MED ORDER — DEXAMETHASONE SODIUM PHOSPHATE 4 MG/ML IJ SOLN
INTRAMUSCULAR | Status: DC | PRN
  Administered 2019-03-03: 4 mg via INTRAVENOUS

## 2019-03-03 MED ORDER — MIDAZOLAM HCL 2 MG/2ML IJ SOLN
1.0000 mg | INTRAMUSCULAR | Status: DC | PRN
  Administered 2019-03-03: 2 mg via INTRAVENOUS

## 2019-03-03 MED ORDER — MIDAZOLAM HCL 2 MG/2ML IJ SOLN
INTRAMUSCULAR | Status: AC
  Filled 2019-03-03: qty 2

## 2019-03-03 MED ORDER — PROMETHAZINE HCL 25 MG/ML IJ SOLN: 6.2500 mg | INTRAMUSCULAR | Status: DC | PRN

## 2019-03-03 MED ORDER — GABAPENTIN 300 MG PO CAPS
300.0000 mg | ORAL_CAPSULE | ORAL | Status: AC
  Administered 2019-03-03: 300 mg via ORAL

## 2019-03-03 MED ORDER — BUPIVACAINE HCL (PF) 0.25 % IJ SOLN
INTRAMUSCULAR | Status: DC | PRN
  Administered 2019-03-03: 20 mL

## 2019-03-03 MED ORDER — GABAPENTIN 300 MG PO CAPS
ORAL_CAPSULE | ORAL | Status: AC
  Filled 2019-03-03: qty 1

## 2019-03-03 SURGICAL SUPPLY — 48 items
APPLIER CLIP 9.375 MED OPEN (MISCELLANEOUS)
BLADE SURG 15 STRL LF DISP TIS (BLADE) ×1 IMPLANT
BLADE SURG 15 STRL SS (BLADE) ×2
CANISTER SUC SOCK COL 7IN (MISCELLANEOUS) ×3 IMPLANT
CANISTER SUCT 1200ML W/VALVE (MISCELLANEOUS) ×3 IMPLANT
CHLORAPREP W/TINT 26 (MISCELLANEOUS) ×3 IMPLANT
CLIP APPLIE 9.375 MED OPEN (MISCELLANEOUS) IMPLANT
COVER BACK TABLE REUSABLE LG (DRAPES) ×3 IMPLANT
COVER MAYO STAND REUSABLE (DRAPES) ×3 IMPLANT
COVER PROBE W GEL 5X96 (DRAPES) ×3 IMPLANT
COVER WAND RF STERILE (DRAPES) IMPLANT
DECANTER SPIKE VIAL GLASS SM (MISCELLANEOUS) IMPLANT
DERMABOND ADVANCED (GAUZE/BANDAGES/DRESSINGS) ×2
DERMABOND ADVANCED .7 DNX12 (GAUZE/BANDAGES/DRESSINGS) ×1 IMPLANT
DRAPE LAPAROSCOPIC ABDOMINAL (DRAPES) ×3 IMPLANT
DRAPE UTILITY XL STRL (DRAPES) ×3 IMPLANT
ELECT COATED BLADE 2.86 ST (ELECTRODE) ×3 IMPLANT
ELECT REM PT RETURN 9FT ADLT (ELECTROSURGICAL) ×3
ELECTRODE REM PT RTRN 9FT ADLT (ELECTROSURGICAL) ×1 IMPLANT
GLOVE BIO SURGEON STRL SZ 6.5 (GLOVE) ×1 IMPLANT
GLOVE BIO SURGEON STRL SZ7.5 (GLOVE) ×6 IMPLANT
GLOVE BIO SURGEONS STRL SZ 6.5 (GLOVE) ×1
GLOVE BIOGEL PI IND STRL 6.5 (GLOVE) IMPLANT
GLOVE BIOGEL PI IND STRL 7.0 (GLOVE) IMPLANT
GLOVE BIOGEL PI INDICATOR 6.5 (GLOVE) ×2
GLOVE BIOGEL PI INDICATOR 7.0 (GLOVE) ×4
GLOVE ECLIPSE 6.5 STRL STRAW (GLOVE) ×2 IMPLANT
GOWN STRL REUS W/ TWL LRG LVL3 (GOWN DISPOSABLE) ×2 IMPLANT
GOWN STRL REUS W/TWL LRG LVL3 (GOWN DISPOSABLE) ×6
ILLUMINATOR WAVEGUIDE N/F (MISCELLANEOUS) IMPLANT
KIT MARKER MARGIN INK (KITS) ×3 IMPLANT
LIGHT WAVEGUIDE WIDE FLAT (MISCELLANEOUS) IMPLANT
NDL HYPO 25X1 1.5 SAFETY (NEEDLE) IMPLANT
NEEDLE HYPO 25X1 1.5 SAFETY (NEEDLE) ×3 IMPLANT
NS IRRIG 1000ML POUR BTL (IV SOLUTION) ×2 IMPLANT
PACK BASIN DAY SURGERY FS (CUSTOM PROCEDURE TRAY) ×3 IMPLANT
PENCIL BUTTON HOLSTER BLD 10FT (ELECTRODE) ×3 IMPLANT
SLEEVE SCD COMPRESS KNEE MED (MISCELLANEOUS) ×3 IMPLANT
SPONGE LAP 18X18 RF (DISPOSABLE) ×3 IMPLANT
SUT MON AB 4-0 PC3 18 (SUTURE) ×3 IMPLANT
SUT SILK 2 0 SH (SUTURE) IMPLANT
SUT VICRYL 3-0 CR8 SH (SUTURE) ×3 IMPLANT
SYR CONTROL 10ML LL (SYRINGE) ×2 IMPLANT
TOWEL GREEN STERILE FF (TOWEL DISPOSABLE) ×3 IMPLANT
TRAY FAXITRON CT DISP (TRAY / TRAY PROCEDURE) ×3 IMPLANT
TUBE CONNECTING 20'X1/4 (TUBING) ×1
TUBE CONNECTING 20X1/4 (TUBING) ×2 IMPLANT
YANKAUER SUCT BULB TIP NO VENT (SUCTIONS) ×2 IMPLANT

## 2019-03-03 NOTE — Op Note (Signed)
03/03/2019  9:59 AM  PATIENT:  Marie Ferguson  53 y.o. female  PRE-OPERATIVE DIAGNOSIS:  LEFT BREAST COMPLEX SCLEROSING LESION X2  POST-OPERATIVE DIAGNOSIS:  LEFT BREAST COMPLEX SCLEROSING LESION X2  PROCEDURE:  Procedure(s): LEFT BREAST LUMPECTOMY X 2 WITH 2 RADIOACTIVE SEED LOCALIZATION (Left)  SURGEON:  Surgeon(s) and Role:    * Jovita Kussmaul, MD - Primary  PHYSICIAN ASSISTANT:   ASSISTANTS: none   ANESTHESIA:   local and general  EBL:  minimal   BLOOD ADMINISTERED:none  DRAINS: none   LOCAL MEDICATIONS USED:  MARCAINE     SPECIMEN:  Source of Specimen:  left breast tissue x 2  DISPOSITION OF SPECIMEN:  PATHOLOGY  COUNTS:  YES  TOURNIQUET:  * No tourniquets in log *  DICTATION: .Dragon Dictation   After informed consent was obtained the patient was brought to the operating room and placed in the supine position on the operating table.  After adequate induction of general anesthesia the patient's left breast was prepped with ChloraPrep, allowed to dry, and draped in usual sterile manner.  An appropriate timeout was performed.  Previously to I-125 seeds were placed in the lower aspect of the left breast to mark 2 areas of complex sclerosing lesion.  The neoprobe was set to I-125 in both areas were readily identified.  The area around this was infiltrated with quarter percent Marcaine.  A small incision was then made on the edge of the areola inferiorly in the left breast.  The incision was carried through the skin and subcutaneous tissue sharply with the electrocautery.  Dissection was then carried out towards both radioactive seeds under the direction of the neoprobe.  Once more closely approached each of the radioactive seeds I then removed a circular portion of breast tissue sharply with the electrocautery around each radioactive seed.  Once each specimen was removed it was oriented with the appropriate paint colors.  A specimen radiograph was done for each specimen which  showed the clip and seed to be in the center of the tissue.  These specimens were labeled medial and lateral to distinguish between the 2.  Both were sent to pathology for further evaluation.  Hemostasis was achieved using the Bovie electrocautery.  The wound was infiltrated with quarter percent Marcaine and irrigated with saline.  The deep layer of the wound was then closed with layers of interrupted 3-0 Vicryl stitches.  The skin was then closed with interrupted 4-0 Monocryl subcuticular stitch.  Dermabond dressings were applied.  The patient tolerated the procedure well.  At the end of the case all needle sponge and instrument counts were correct.  The patient was then awakened and taken to recovery in stable condition.  PLAN OF CARE: Discharge to home after PACU  PATIENT DISPOSITION:  PACU - hemodynamically stable.   Delay start of Pharmacological VTE agent (>24hrs) due to surgical blood loss or risk of bleeding: not applicable

## 2019-03-03 NOTE — Transfer of Care (Signed)
Immediate Anesthesia Transfer of Care Note  Patient: Marie Ferguson  Procedure(s) Performed: LEFT BREAST LUMPECTOMY X 2 WITH 2 RADIOACTIVE SEED LOCALIZATION (Left Breast)  Patient Location: PACU  Anesthesia Type:General  Level of Consciousness: drowsy and patient cooperative  Airway & Oxygen Therapy: Patient Spontanous Breathing and Patient connected to face mask oxygen  Post-op Assessment: Report given to RN and Post -op Vital signs reviewed and stable  Post vital signs: Reviewed and stable  Last Vitals:  Vitals Value Taken Time  BP    Temp    Pulse 68 03/03/19 1002  Resp 14 03/03/19 1002  SpO2 97 % 03/03/19 1002  Vitals shown include unvalidated device data.  Last Pain:  Vitals:   03/03/19 0809  TempSrc: Oral  PainSc: 0-No pain         Complications: No apparent anesthesia complications

## 2019-03-03 NOTE — Anesthesia Postprocedure Evaluation (Signed)
Anesthesia Post Note  Patient: Marie Ferguson  Procedure(s) Performed: LEFT BREAST LUMPECTOMY X 2 WITH 2 RADIOACTIVE SEED LOCALIZATION (Left Breast)     Patient location during evaluation: PACU Anesthesia Type: General Level of consciousness: awake and alert Pain management: pain level controlled Vital Signs Assessment: post-procedure vital signs reviewed and stable Respiratory status: spontaneous breathing, nonlabored ventilation, respiratory function stable and patient connected to nasal cannula oxygen Cardiovascular status: blood pressure returned to baseline and stable Postop Assessment: no apparent nausea or vomiting Anesthetic complications: no    Last Vitals:  Vitals:   03/03/19 1030 03/03/19 1102  BP: (!) 142/89 (!) 145/93  Pulse: 71 78  Resp: 18 18  Temp:  36.5 C  SpO2: 100% 95%    Last Pain:  Vitals:   03/03/19 1102  TempSrc: Oral  PainSc: 0-No pain                 Tevon Berhane S

## 2019-03-03 NOTE — Discharge Instructions (Signed)
°  Post Anesthesia Home Care Instructions  Activity: Get plenty of rest for the remainder of the day. A responsible individual must stay with you for 24 hours following the procedure.  For the next 24 hours, DO NOT: -Drive a car -Paediatric nurse -Drink alcoholic beverages -Take any medication unless instructed by your physician -Make any legal decisions or sign important papers.  Meals: Start with liquid foods such as gelatin or soup. Progress to regular foods as tolerated. Avoid greasy, spicy, heavy foods. If nausea and/or vomiting occur, drink only clear liquids until the nausea and/or vomiting subsides. Call your physician if vomiting continues.  Special Instructions/Symptoms: Your throat may feel dry or sore from the anesthesia or the breathing tube placed in your throat during surgery. If this causes discomfort, gargle with warm salt water. The discomfort should disappear within 24 hours.  If you had a scopolamine patch placed behind your ear for the management of post- operative nausea and/or vomiting:  1. The medication in the patch is effective for 72 hours, after which it should be removed.  Wrap patch in a tissue and discard in the trash. Wash hands thoroughly with soap and water. 2. You may remove the patch earlier than 72 hours if you experience unpleasant side effects which may include dry mouth, dizziness or visual disturbances. 3. Avoid touching the patch. Wash your hands with soap and water after contact with the patch.   No tylenol until after 2pm today.  No ibuprofen until after 4pm today.

## 2019-03-03 NOTE — H&P (Signed)
Marie Ferguson  Location: [facility] Patient #: [id number] DOB: 07/07/1995 Unknown / Language: Cleophus Molt / Race: Black or African American Female   History of Present Illness  The patient is a 53 year old female who presents with a breast mass. We are asked to see the patient in consultation by Dr. Vickii Chafe constant to evaluate her for a complex sclerosing lesion of the left breast. The patient is a 54 year old black female who recently went for a routine screening mammogram. At that time she was found to have scattered calcifications in both breasts. There was a mass in the right breast seen in 2 clusters of calcifications in the left breast. Each of these 3 areas was biopsied. The right breast came back benign. The left breast areas came back as complex sclerosing lesions. She has had some significant pain in the left breast since the biopsy. Before that she had no breast issues.   Past Surgical History  Breast Biopsy  Bilateral. Cesarean Section - Multiple  Hysterectomy (not due to cancer) - Complete   Diagnostic Studies History  Colonoscopy  never Mammogram  within last year Pap Smear  1-5 years ago  Allergies  No Known Drug Allergies    Medication History  Norvasc (10MG  Tablet, Oral) Active. hydroCHLOROthiazide (25MG  Tablet, Oral) Active. Medications Reconciled  Social History  Alcohol use  Occasional alcohol use. Caffeine use  Tea. Illicit drug use  Uses weekly. Tobacco use  Current some day smoker.  Family History  Heart Disease  Father. Heart disease in female family member before age 79  Hypertension  Father, Mother. Ovarian Cancer  Mother.  Pregnancy / Birth History  Age at menarche  53 years. Gravida  3 Maternal age  66-20 Para  3  Other Problems High blood pressure     Review of Systems  General Not Present- Appetite Loss, Chills, Fatigue, Fever, Night Sweats, Weight Gain and Weight Loss. Skin Not Present- Change in  Wart/Mole, Dryness, Hives, Jaundice, New Lesions, Non-Healing Wounds, Rash and Ulcer. HEENT Present- Wears glasses/contact lenses. Not Present- Earache, Hearing Loss, Hoarseness, Nose Bleed, Oral Ulcers, Ringing in the Ears, Seasonal Allergies, Sinus Pain, Sore Throat, Visual Disturbances and Yellow Eyes. Respiratory Not Present- Bloody sputum, Chronic Cough, Difficulty Breathing, Snoring and Wheezing. Breast Present- Breast Pain. Not Present- Breast Mass, Nipple Discharge and Skin Changes. Cardiovascular Not Present- Chest Pain, Difficulty Breathing Lying Down, Leg Cramps, Palpitations, Rapid Heart Rate, Shortness of Breath and Swelling of Extremities. Gastrointestinal Not Present- Abdominal Pain, Bloating, Bloody Stool, Change in Bowel Habits, Chronic diarrhea, Constipation, Difficulty Swallowing, Excessive gas, Gets full quickly at meals, Hemorrhoids, Indigestion, Nausea, Rectal Pain and Vomiting. Female Genitourinary Not Present- Frequency, Nocturia, Painful Urination, Pelvic Pain and Urgency. Musculoskeletal Not Present- Back Pain, Joint Pain, Joint Stiffness, Muscle Pain, Muscle Weakness and Swelling of Extremities. Neurological Not Present- Decreased Memory, Fainting, Headaches, Numbness, Seizures, Tingling, Tremor, Trouble walking and Weakness. Psychiatric Not Present- Anxiety, Bipolar, Change in Sleep Pattern, Depression, Fearful and Frequent crying. Endocrine Not Present- Cold Intolerance, Excessive Hunger, Hair Changes, Heat Intolerance, Hot flashes and New Diabetes. Hematology Not Present- Blood Thinners, Easy Bruising, Excessive bleeding, Gland problems, HIV and Persistent Infections.  Vitals  Weight: 194.4 lb Height: 63in Body Surface Area: 1.91 m Body Mass Index: 34.44 kg/m  Temp.: 98.87F  Pulse: 90 (Regular)  P.OX: 97% (Room air) BP: 160/100 (Sitting, Left Arm, Standard)       Physical Exam  General Mental Status-Alert. General Appearance-Consistent with  stated age. Hydration-Well hydrated. Voice-Normal.  Head and Neck Head-normocephalic, atraumatic with no lesions or palpable masses. Trachea-midline. Thyroid Gland Characteristics - normal size and consistency.  Eye Eyeball - Bilateral-Extraocular movements intact. Sclera/Conjunctiva - Bilateral-No scleral icterus.  Chest and Lung Exam Chest and lung exam reveals -quiet, even and easy respiratory effort with no use of accessory muscles and on auscultation, normal breath sounds, no adventitious sounds and normal vocal resonance. Inspection Chest Wall - Normal. Back - normal.  Breast Note: There is no palpable mass in either breast. There is no palpable axillary, supraclavicular, or cervical lymphadenopathy. There is significant tenderness of the left breast which I think is from a small hematoma.   Cardiovascular Cardiovascular examination reveals -normal heart sounds, regular rate and rhythm with no murmurs and normal pedal pulses bilaterally.  Abdomen Inspection Inspection of the abdomen reveals - No Hernias. Skin - Scar - no surgical scars. Palpation/Percussion Palpation and Percussion of the abdomen reveal - Soft, Non Tender, No Rebound tenderness, No Rigidity (guarding) and No hepatosplenomegaly. Auscultation Auscultation of the abdomen reveals - Bowel sounds normal.  Neurologic Neurologic evaluation reveals -alert and oriented x 3 with no impairment of recent or remote memory. Mental Status-Normal.  Musculoskeletal Normal Exam - Left-Upper Extremity Strength Normal and Lower Extremity Strength Normal. Normal Exam - Right-Upper Extremity Strength Normal and Lower Extremity Strength Normal.  Lymphatic Head & Neck  General Head & Neck Lymphatics: Bilateral - Description - Normal. Axillary  General Axillary Region: Bilateral - Description - Normal. Tenderness - Non Tender. Femoral & Inguinal  Generalized Femoral & Inguinal Lymphatics:  Bilateral - Description - Normal. Tenderness - Non Tender.    Assessment & Plan  SCLEROSING ADENOSIS OF BREAST, LEFT (N60.22) Impression: The patient appears to have 2 areas of complex sclerosing lesion in the left breast. Because there is a 5-10% chance of missing something more significant at each of these areas and because of the scattered nature of the calcifications in the left breast I think it would be reasonable to remove these areas. She would also like to have this done. I have discussed with her in detail the risks and benefits of the operation as well as some of the technical aspects including the use of a radioactive seed for localization and she understands and wishes to proceed.

## 2019-03-03 NOTE — Anesthesia Procedure Notes (Signed)
Procedure Name: LMA Insertion Date/Time: 03/03/2019 9:17 AM Performed by: Signe Colt, CRNA Pre-anesthesia Checklist: Patient identified, Emergency Drugs available, Suction available and Patient being monitored Patient Re-evaluated:Patient Re-evaluated prior to induction Oxygen Delivery Method: Circle system utilized Preoxygenation: Pre-oxygenation with 100% oxygen Induction Type: IV induction Ventilation: Mask ventilation without difficulty LMA: LMA inserted LMA Size: 4.0 Number of attempts: 1 Airway Equipment and Method: Bite block Placement Confirmation: positive ETCO2 Tube secured with: Tape Dental Injury: Teeth and Oropharynx as per pre-operative assessment

## 2019-03-03 NOTE — Interval H&P Note (Signed)
History and Physical Interval Note:  03/03/2019 8:59 AM  Marie Ferguson  has presented today for surgery, with the diagnosis of LEFT BREAST COMPLEX SCLEROSING LESION.  The various methods of treatment have been discussed with the patient and family. After consideration of risks, benefits and other options for treatment, the patient has consented to  Procedure(s): LEFT BREAST LUMPECTOMY X 2 WITH 2 RADIOACTIVE SEED LOCALIZATION (Left) as a surgical intervention.  The patient's history has been reviewed, patient examined, no change in status, stable for surgery.  I have reviewed the patient's chart and labs.  Questions were answered to the patient's satisfaction.     [name]

## 2019-03-03 NOTE — Anesthesia Preprocedure Evaluation (Signed)
Anesthesia Evaluation  Patient identified by MRN, date of birth, ID band Patient awake    Reviewed: Allergy & Precautions, NPO status , Patient's Chart, lab work & pertinent test results  Airway Mallampati: II  TM Distance: >3 FB Neck ROM: Full    Dental no notable dental hx.    Pulmonary Current Smoker,    Pulmonary exam normal breath sounds clear to auscultation       Cardiovascular hypertension, Normal cardiovascular exam Rhythm:Regular Rate:Normal     Neuro/Psych negative neurological ROS  negative psych ROS   GI/Hepatic negative GI ROS, Neg liver ROS,   Endo/Other  negative endocrine ROS  Renal/GU negative Renal ROS  negative genitourinary   Musculoskeletal negative musculoskeletal ROS (+)   Abdominal   Peds negative pediatric ROS (+)  Hematology negative hematology ROS (+)   Anesthesia Other Findings   Reproductive/Obstetrics negative OB ROS                             Anesthesia Physical Anesthesia Plan  ASA: II  Anesthesia Plan: General   Post-op Pain Management:    Induction: Intravenous  PONV Risk Score and Plan: 2 and Ondansetron, Dexamethasone, Treatment may vary due to age or medical condition and Midazolam  Airway Management Planned: LMA  Additional Equipment:   Intra-op Plan:   Post-operative Plan: Extubation in OR  Informed Consent: I have reviewed the patients History and Physical, chart, labs and discussed the procedure including the risks, benefits and alternatives for the proposed anesthesia with the patient or authorized representative who has indicated his/her understanding and acceptance.     Dental advisory given  Plan Discussed with: CRNA and Surgeon  Anesthesia Plan Comments:         Anesthesia Quick Evaluation

## 2019-03-06 ENCOUNTER — Encounter (HOSPITAL_BASED_OUTPATIENT_CLINIC_OR_DEPARTMENT_OTHER): Payer: Self-pay | Admitting: General Surgery

## 2019-03-07 ENCOUNTER — Telehealth: Payer: Self-pay | Admitting: Internal Medicine

## 2019-03-07 DIAGNOSIS — D242 Benign neoplasm of left breast: Secondary | ICD-10-CM

## 2019-03-07 LAB — SURGICAL PATHOLOGY

## 2019-03-07 NOTE — Telephone Encounter (Signed)
Phone call placed to patient this morning to discuss pathology results from lumpectomy that was done on her left breast.  This showed some sclerosing papilloma.  I recommend referring her to an oncologist to get an opinion of whether she needs any further treatment at papillomas can sometimes be precancerous.  I will submit the referral to the oncologist.  Patient tells me that she has a follow-up appointment with the surgeon on the 13th of this month.  I told her to go ahead and keep that appointment and to let me know also with of the surgeon thinks she needs to see an oncologist or not.  Patient expressed understanding.

## 2019-03-14 ENCOUNTER — Ambulatory Visit: Payer: Self-pay | Admitting: Internal Medicine

## 2019-04-10 ENCOUNTER — Other Ambulatory Visit: Payer: Self-pay | Admitting: Internal Medicine

## 2019-04-10 DIAGNOSIS — I10 Essential (primary) hypertension: Secondary | ICD-10-CM

## 2019-04-10 MED FILL — ?AMLODIPINE BESYLATE 10 MG: 10 | 30 days supply | Qty: 30 | Fill #2

## 2019-04-12 MED FILL — ?HYDROCHLOROTHIAZIDE 25MG T: 25 | 30 days supply | Qty: 30 | Fill #0

## 2019-05-18 ENCOUNTER — Ambulatory Visit: Payer: No Typology Code available for payment source | Admitting: Internal Medicine

## 2019-05-24 ENCOUNTER — Other Ambulatory Visit: Payer: Self-pay | Admitting: Internal Medicine

## 2019-05-24 MED FILL — ?HYDROCHLOROTHIAZIDE 25MG T: 25 | 30 days supply | Qty: 30 | Fill #1

## 2019-05-26 MED FILL — AMLODIPINE BESYLATE 10 MG T: 10 | 30 days supply | Qty: 30 | Fill #0

## 2019-06-22 ENCOUNTER — Other Ambulatory Visit: Payer: Self-pay

## 2019-06-22 ENCOUNTER — Encounter: Payer: Self-pay | Admitting: Internal Medicine

## 2019-06-22 ENCOUNTER — Other Ambulatory Visit: Payer: Self-pay | Admitting: Internal Medicine

## 2019-06-22 ENCOUNTER — Ambulatory Visit: Payer: Self-pay | Attending: Internal Medicine | Admitting: Internal Medicine

## 2019-06-22 DIAGNOSIS — Z8659 Personal history of other mental and behavioral disorders: Secondary | ICD-10-CM

## 2019-06-22 DIAGNOSIS — Z114 Encounter for screening for human immunodeficiency virus [HIV]: Secondary | ICD-10-CM

## 2019-06-22 DIAGNOSIS — F17200 Nicotine dependence, unspecified, uncomplicated: Secondary | ICD-10-CM

## 2019-06-22 DIAGNOSIS — I10 Essential (primary) hypertension: Secondary | ICD-10-CM

## 2019-06-22 DIAGNOSIS — E669 Obesity, unspecified: Secondary | ICD-10-CM

## 2019-06-22 DIAGNOSIS — Z1211 Encounter for screening for malignant neoplasm of colon: Secondary | ICD-10-CM

## 2019-06-22 DIAGNOSIS — E782 Mixed hyperlipidemia: Secondary | ICD-10-CM

## 2019-06-22 DIAGNOSIS — K921 Melena: Secondary | ICD-10-CM

## 2019-06-22 DIAGNOSIS — D242 Benign neoplasm of left breast: Secondary | ICD-10-CM | POA: Insufficient documentation

## 2019-06-22 DIAGNOSIS — F17210 Nicotine dependence, cigarettes, uncomplicated: Secondary | ICD-10-CM

## 2019-06-22 MED ORDER — HYDROCHLOROTHIAZIDE 25 MG PO TABS: 25.0000 mg | ORAL_TABLET | Freq: Every day | ORAL | 2 refills | Status: DC

## 2019-06-22 MED ORDER — AMLODIPINE BESYLATE 10 MG PO TABS: 10.0000 mg | ORAL_TABLET | Freq: Every day | ORAL | 2 refills | Status: DC

## 2019-06-22 MED FILL — ?AMLODIPINE BESYL 10MG TABL: 10 | 30 days supply | Qty: 30 | Fill #0

## 2019-06-22 MED FILL — ?HYDROCHLOROTHIAZIDE 25MG T: 25 | 30 days supply | Qty: 30 | Fill #0

## 2019-06-22 NOTE — Progress Notes (Signed)
Virtual Visit via Telephone Note This was supposed to be an in person visit but was changed to a telephone visit due to icy weather conditions and the clinic being closed because of it.  I connected with Marie Ferguson on 06/22/19 at  9:30 AM EST by telephone and verified that I am speaking with the correct person using two identifiers. I am at home.  The patient is at home.  Only the patient and myself participated in this encounter. I discussed the limitations, risks, security and privacy concerns of performing an evaluation and management service by telephone and the availability of in person appointments. I also discussed with the patient that there may be a patient responsible charge related to this service. The patient expressed understanding and agreed to proceed.   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 Darrel Ferguson, tob dep.  Last seen 02/2019.  Today's visit is for chronic ds management.  Reports a change in mental states episode at work 04/2019.  Works at [employer]. 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  According to her co-workers she did not lose consciousness but was standing there starring off into space.  Lasted about 7 minutes.  No shaking or incontinence.  +sweating.  No hx of sz.  She was not seen in ER.  She continued working her shift.  No recurrent episodes. Thinks she has DM -endorses frequent urination during the day.  + polydipsia.  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  No blurred vision in LT.  Wears glasses.  Has eye appt scheduled with Dr. Schuyler Ferguson 07/2019.  Fhx of DM in maternal aunts and nephews.   C/o having blood in stools x 6 mths. BMs have been soft.  No rectal pain.  No hemorrhoids. No wgh changes No fhx of colon cancer. She now has Medicaid  Had biopsy on both breasts last yr.  pathology on the right revealed fibrocystic changes negative for CEA.  The lesion in the left breast was removed by lumpectomy and pathology revealed complex sclerosis with fibrocystic changes and intraductal papillomas which had benign pathology.  Obesity/HL:  Lipid profile elev last yr.  She wanted to give a trail of dietary changes and increase physical activity.  Reports she is constantly moving at work.  Not getting in any exercise outside of work.  She has cut back on eating fatty foods.    HYPERTENSION Currently taking: see medication list Med Adherence: []  Yes    [x]  No - out of meds x 1 wk.  Did not have time to come pick up meds Medication side effects: []  Yes    [x]  No Adherence with salt restriction: [x]  Yes    []  No Home Monitoring?: [x]  Yes    []  No Monitoring Frequency:  daily Home BP results range:  When on meds "it be so good."   Uncontrolled since being off medications. SOB? []  Yes    [x]  No Chest Pain?: []  Yes    [x]  No Leg swelling?: []  Yes    [x]  No Headaches?: []  Yes    [x]  No Dizziness? []  Yes    [x]  No Comments:   Tob dep:  Smoking 2 cigarettes a wk.  Trying to quit.     Outpatient Encounter Medications as of 06/22/2019  Medication Sig  .  acetaminophen (TYLENOL) 325 MG tablet Take 650 mg by mouth every 6 (six) hours as needed.  Marie Ferguson amLODipine (NORVASC) 10 MG tablet Take 1 tablet (10 mg total) by mouth daily. Please make PCP appointment.  . hydrochlorothiazide (HYDRODIURIL) 25 MG tablet TAKE 1 TABLET (25 MG TOTAL) BY MOUTH DAILY.  Marie Ferguson HYDROcodone-acetaminophen (NORCO/VICODIN) 5-325 MG tablet Take 1-2 tablets by mouth every 6 (six) hours as needed for moderate pain or severe pain.   No facility-administered encounter medications on file as of 06/22/2019.      Observations/Objective: Lab Results  Component Value Date   CHOL 216 (H) 11/07/2018   HDL 52 11/07/2018   LDLCALC 134 (H) 11/07/2018   TRIG 152 (H) 11/07/2018   CHOLHDL 4.2 11/07/2018   Lab Results   Component Value Date   WBC 4.6 10/29/2018   HGB 13.8 10/29/2018   HCT 41.7 10/29/2018   MCV 88.3 10/29/2018   PLT 368 10/29/2018     Assessment and Plan: 1. Essential hypertension Not at goal due to being out of her medication for 1 month.  She is agreeable to doing 90-day supply so that she does not have to come to the pharmacy every month.  Encouraged to continue low-salt diet - CBC; Future - Comprehensive metabolic panel; Future - amLODipine (NORVASC) 10 MG tablet; Take 1 tablet (10 mg total) by mouth daily. Please make PCP appointment.  Dispense: 90 tablet; Refill: 2 - hydrochlorothiazide (HYDRODIURIL) 25 MG tablet; Take 1 tablet (25 mg total) by mouth daily.  Dispense: 90 tablet; Refill: 2  2. Mental status change resolved This sounds as though she may have had near syncope.  Advised patient to report any further episodes as she would need work-up at that point.  Encouraged her to stay hydrated especially when working in a hot environment  3. Blood in stool Overdue for colon cancer screening.  She is agreeable to referral to the gastroenterologist for colonoscopy - Ambulatory referral to Gastroenterology  4. Screening for colon cancer See #3 above - Ambulatory referral to Gastroenterology  5. Tobacco dependence Commended her on how much she has cut back.  She is trying to quit.  Encouraged her to set a quit date beyond which she does not purchase cigarettes.  She is agreeable to this plan.  Less than 5 minutes spent on counseling.  6. Hyperlipidemia, mixed Plan is to recheck the lipid profile to see whether it has improved.  If not she is agreeable to statin therapy. - Lipid panel; Future  7. Obesity (BMI 30-39.9) Dietary counseling given.  She is agreeable to seeing a nutritionist. Encouraged her to try to get in some exercise outside of work with a goal of 30 minutes 4-5 times a week. 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  Benign pathology.  9. Screening for HIV (human immunodeficiency virus) This was offered and patient was agreeable to screening. - HIV Antibody (routine testing w rflx); Future   Follow Up Instructions: 3 months   I discussed the assessment and treatment plan with the patient. The patient was provided an opportunity to ask questions and all were answered. The patient agreed with the plan and demonstrated an understanding of the instructions.   The patient was advised to call back or seek an in-person evaluation if the symptoms worsen or if the condition fails to improve as anticipated.  I provided 28 minutes of non-face-to-face time during this encounter.  Karle Plumber, MD

## 2019-06-23 ENCOUNTER — Encounter: Payer: Self-pay | Admitting: Physician Assistant

## 2019-07-06 ENCOUNTER — Ambulatory Visit (INDEPENDENT_AMBULATORY_CARE_PROVIDER_SITE_OTHER): Payer: Self-pay | Admitting: Physician Assistant

## 2019-07-06 ENCOUNTER — Encounter: Payer: Self-pay | Admitting: Physician Assistant

## 2019-07-06 VITALS — BP 180/110 | HR 76 | Temp 98.1°F | Ht 58.5 in | Wt 192.5 lb

## 2019-07-06 DIAGNOSIS — K625 Hemorrhage of anus and rectum: Secondary | ICD-10-CM

## 2019-07-06 DIAGNOSIS — Z1211 Encounter for screening for malignant neoplasm of colon: Secondary | ICD-10-CM

## 2019-07-06 DIAGNOSIS — Z01818 Encounter for other preprocedural examination: Secondary | ICD-10-CM

## 2019-07-06 MED ORDER — NA SULFATE-K SULFATE-MG SULF 17.5-3.13-1.6 GM/177ML PO SOLN: 1.0000 | Freq: Once | ORAL | 0 refills | Status: AC

## 2019-07-06 MED FILL — SUPREP BOWEL PREP KIT: 17.5-3.13-1 | 1 days supply | Qty: 354 | Fill #0

## 2019-07-06 NOTE — Patient Instructions (Signed)
You have been scheduled for a colonoscopy. Please follow written instructions given to you at your visit today.  Please pick up your prep supplies at the pharmacy within the next 1-3 days. If you use inhalers (even only as needed), please bring them with you on the day of your procedure.   

## 2019-07-06 NOTE — Progress Notes (Signed)
Agree with assessment and plan as outlined.  

## 2019-07-06 NOTE — Progress Notes (Signed)
Subjective:    Patient ID: Marie Ferguson, female    DOB: March 28, 1966, 54 y.o.   MRN: [id number]  HPI Marie Ferguson is a pleasant 54 year old African-American female, new to GI today, referred by Dr. Karle Plumber due to recent complaints of blood in the stool. Patient has not had any prior GI evaluation.  She does have history of hypertension, hypertensive retinopathy and hyperlipidemia. She says that she has been noticing a small amount of blood with her bowel movements off and on over the past few months.  This may occur a couple of times per week.  She says the blood is dark red and she notices it in the commode, separate from the stool.  She has not had any abdominal pain or cramping, no changes in bowel habits.  Appetite has been fine.  No nausea.  She denies any rectal discomfort, and no prior history of hemorrhoids.  Family history is negative for colon cancer as far she is aware.  Review of Systems Pertinent positive and negative review of systems were noted in the above HPI section.  All other review of systems was otherwise negative.  Outpatient Encounter Medications as of 07/06/2019  Medication Sig  . acetaminophen (TYLENOL) 325 MG tablet Take 650 mg by mouth every 6 (six) hours as needed.  [name] amLODipine (NORVASC) 10 MG tablet Take 1 tablet (10 mg total) by mouth daily. Please make PCP appointment.  . hydrochlorothiazide (HYDRODIURIL) 25 MG tablet Take 1 tablet (25 mg total) by mouth daily.  . Na Sulfate-K Sulfate-Mg Sulf 17.5-3.13-1.6 GM/177ML SOLN Take 1 kit by mouth once for 1 dose.   No facility-administered encounter medications on file as of 07/06/2019.   No Known Allergies Patient Active Problem List   Diagnosis Date Noted  . Intraductal papilloma of left breast 06/22/2019  . Screening breast examination 12/29/2018  . Breast pain, left 12/29/2018  . Hyperlipidemia, mixed 12/08/2018  . Hypertensive retinopathy of both eyes 11/07/2018  . Essential hypertension 11/07/2018  . 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 Tobacco dependence 11/07/2018  . Obesity (BMI 30-39.9) 11/07/2018  . Breast mass in female 11/07/2018   Social History   Socioeconomic History  . Marital status: Single    Spouse name: Not on file  . Number of children: 3  . Years of education: 1 yr community college  . Highest education level: Some college, no degree  Occupational History  . Occupation: [employer]  Tobacco Use  . Smoking status: Current Every Day Smoker    Packs/day: 0.00    Types: Cigarettes  . Smokeless tobacco: Never Used  . Tobacco comment: 1-3 cigarettes per day  Substance and Sexual Activity  . Alcohol use: Yes    Comment: rarely  . Drug use: Yes    Types: Marijuana    Comment: 1x a week  . Sexual activity: Not Currently  Other Topics Concern  . Not on file  Social History Narrative  . Not on file   Social Determinants of Health   Financial Resource Strain:   . Difficulty of Paying Living Expenses: Not on file  Food Insecurity:   . Worried About Charity fundraiser in the Last Year: Not on file  . Ran Out of Food in the Last Year: Not on file  Transportation Needs: Unmet Transportation Needs  . Lack of Transportation (Medical): Yes  . Lack of Transportation (Non-Medical): Yes  Physical Activity:   . Days of Exercise per Week: Not on file  .  Minutes of Exercise per Session: Not on file  Stress:   . Feeling of Stress : Not on file  Social Connections:   . Frequency of Communication with Friends and Family: Not on file  . Frequency of Social Gatherings with Friends and Family: Not on file  . Attends Religious Services: Not on file  . Active Member of Clubs or Organizations: Not on file  . Attends Archivist Meetings: Not on file  . Marital Status: Not on file  Intimate Partner Violence:   . Fear of Current or Ex-Partner: Not on file  . Emotionally Abused: Not on file  . Physically Abused: Not on file  . Sexually Abused: Not on file     Marie Ferguson's family history includes Diabetes in her maternal aunt and maternal uncle; Heart attack in her brother; Hypertension in her father and mother.      Objective:    Vitals:   07/06/19 0825  BP: (!) 180/110  Pulse: 76  Temp: 98.1 F (36.7 C)    Physical Exam Well-developed well-nourished  AAfemale in no acute distress.  Height, JGZQJS,473 BMI39  HEENT; nontraumatic normocephalic, EOMI, PER R LA, sclera anicteric. Oropharynx;not examined Neck; supple, no JVD Cardiovascular; regular rate and rhythm with S1-S2, no murmur rub or gallop Pulmonary; Clear bilaterally Abdomen; soft, nontender, nondistended, no palpable mass or hepatosplenomegaly, bowel sounds are active Rectal;not done today Skin; benign exam, no jaundice rash or appreciable lesions Extremities; no clubbing cyanosis or edema skin warm and dry Neuro/Psych; alert and oriented x4, grossly nonfocal mood and affect appropriate       Assessment & Plan:   #37 54 year old African-American female with intermittent small-volume hematochezia over the past couple of months.  Patient describes the blood as being dark red and noticed in the commode separate from bowel movements.   Rule out bleeding secondary to internal hemorrhoids, rule out occult neoplasm  #2 hypertension 3.  Hyperlipidemia 4.  Obesity  Plan; Patient will be scheduled for colonoscopy with Dr. Havery Moros.  Procedure was discussed in detail with the patient including indications risks and benefits and she is agreeable to proceed. We discussed Covid testing and she is agreeable to Covid test 2 days prior to procedure.  Marie Ferguson Genia Harold PA-C 07/06/2019   Cc: Ladell Pier, MD

## 2019-07-13 ENCOUNTER — Ambulatory Visit: Payer: No Typology Code available for payment source | Admitting: Pharmacist

## 2019-07-14 ENCOUNTER — Ambulatory Visit: Payer: No Typology Code available for payment source | Admitting: Pharmacist

## 2019-07-19 ENCOUNTER — Ambulatory Visit (INDEPENDENT_AMBULATORY_CARE_PROVIDER_SITE_OTHER): Payer: Self-pay

## 2019-07-19 ENCOUNTER — Other Ambulatory Visit: Payer: Self-pay | Admitting: Gastroenterology

## 2019-07-19 DIAGNOSIS — Z1159 Encounter for screening for other viral diseases: Secondary | ICD-10-CM

## 2019-07-19 LAB — SARS CORONAVIRUS 2 (TAT 6-24 HRS): SARS Coronavirus 2: NEGATIVE

## 2019-07-20 ENCOUNTER — Encounter: Payer: Medicaid Other | Attending: Internal Medicine | Admitting: Dietician

## 2019-07-21 ENCOUNTER — Ambulatory Visit (AMBULATORY_SURGERY_CENTER): Payer: Self-pay | Admitting: Gastroenterology

## 2019-07-21 ENCOUNTER — Encounter: Payer: Self-pay | Admitting: Gastroenterology

## 2019-07-21 ENCOUNTER — Other Ambulatory Visit: Payer: Self-pay

## 2019-07-21 VITALS — BP 148/84 | HR 75 | Temp 97.7°F | Resp 17 | Ht <= 58 in | Wt 192.0 lb

## 2019-07-21 DIAGNOSIS — D120 Benign neoplasm of cecum: Secondary | ICD-10-CM

## 2019-07-21 DIAGNOSIS — K625 Hemorrhage of anus and rectum: Secondary | ICD-10-CM

## 2019-07-21 DIAGNOSIS — K648 Other hemorrhoids: Secondary | ICD-10-CM

## 2019-07-21 DIAGNOSIS — D125 Benign neoplasm of sigmoid colon: Secondary | ICD-10-CM

## 2019-07-21 DIAGNOSIS — D123 Benign neoplasm of transverse colon: Secondary | ICD-10-CM

## 2019-07-21 DIAGNOSIS — K5730 Diverticulosis of large intestine without perforation or abscess without bleeding: Secondary | ICD-10-CM

## 2019-07-21 HISTORY — PX: COLONOSCOPY: SHX174

## 2019-07-21 HISTORY — PX: POLYPECTOMY: SHX149

## 2019-07-21 MED ORDER — SODIUM CHLORIDE 0.9 % IV SOLN: 500.0000 mL | Freq: Once | INTRAVENOUS | Status: DC

## 2019-07-21 NOTE — Progress Notes (Signed)
A and O x3. Report to RN. Tolerated MAC anesthesia well.

## 2019-07-21 NOTE — Progress Notes (Signed)
Called to room to assist during endoscopic procedure.  Patient ID and intended procedure confirmed with present staff. Received instructions for my participation in the procedure from the performing physician.  

## 2019-07-21 NOTE — Patient Instructions (Signed)
Impression/Recommendations:  Polyp handout given to patient. Diverticulosis handout given to patient. Hemorrhoid handout given to patient.  Resume previous diet. Continue present medications.  Await pathology results.  No ibuprofen, naproxen, or other NSAID drugs for 2 weeks.  Tylenol only.  YOU HAD AN ENDOSCOPIC PROCEDURE TODAY AT [facility]:   Refer to the procedure report that was given to you for any specific questions about what was found during the examination.  If the procedure report does not answer your questions, please call your gastroenterologist to clarify.  If you requested that your care partner not be given the details of your procedure findings, then the procedure report has been included in a sealed envelope for you to review at your convenience later.  YOU SHOULD EXPECT: Some feelings of bloating in the abdomen. Passage of more gas than usual.  Walking can help get rid of the air that was put into your GI tract during the procedure and reduce the bloating. If you had a lower endoscopy (such as a colonoscopy or flexible sigmoidoscopy) you may notice spotting of blood in your stool or on the toilet paper. If you underwent a bowel prep for your procedure, you may not have a normal bowel movement for a few days.  Please Note:  You might notice some irritation and congestion in your nose or some drainage.  This is from the oxygen used during your procedure.  There is no need for concern and it should clear up in a day or so.  SYMPTOMS TO REPORT IMMEDIATELY:   Following lower endoscopy (colonoscopy or flexible sigmoidoscopy):  Excessive amounts of blood in the stool  Significant tenderness or worsening of abdominal pains  Swelling of the abdomen that is new, acute  Fever of 100F or higher  For urgent or emergent issues, a gastroenterologist can be reached at any hour by calling [phone]. Do not use MyChart messaging for urgent concerns.    DIET:   We do recommend a small meal at first, but then you may proceed to your regular diet.  Drink plenty of fluids but you should avoid alcoholic beverages for 24 hours.  ACTIVITY:  You should plan to take it easy for the rest of today and you should NOT DRIVE or use heavy machinery until tomorrow (because of the sedation medicines used during the test).    FOLLOW UP: Our staff will call the number listed on your records 48-72 hours following your procedure to check on you and address any questions or concerns that you may have regarding the information given to you following your procedure. If we do not reach you, we will leave a message.  We will attempt to reach you two times.  During this call, we will ask if you have developed any symptoms of COVID 19. If you develop any symptoms (ie: fever, flu-like symptoms, shortness of breath, cough etc.) before then, please call [phone].  If you test positive for Covid 19 in the 2 weeks post procedure, please call and report this information to [place].    If any biopsies were taken you will be contacted by phone or by letter within the next 1-3 weeks.  Please call us at [phone] if you have not heard about the biopsies in 3 weeks.    SIGNATURES/CONFIDENTIALITY: You and/or your care partner have signed paperwork which will be entered into your electronic medical record.  These signatures attest to the fact that that the information above on your After  Visit Summary has been reviewed and is understood.  Full responsibility of the confidentiality of this discharge information lies with you and/or your care-partner. 

## 2019-07-21 NOTE — Op Note (Signed)
[facility] Patient Name: Marie Ferguson Procedure Date: 07/21/2019 4:09 PM MRN: [id number] Endoscopist: Remo Lipps P. Havery Moros , MD Age: 54 Referring MD:  Date of Birth: [birth year] Gender: Female Account #: [id number] Procedure:                Colonoscopy Indications:              This is the patient's first colonoscopy, Rectal                            bleeding Medicines:                Monitored Anesthesia Care Procedure:                Pre-Anesthesia Assessment:                           - Prior to the procedure, a History and Physical                            was performed, and patient medications and                            allergies were reviewed. The patient's tolerance of                            previous anesthesia was also reviewed. The risks                            and benefits of the procedure and the sedation                            options and risks were discussed with the patient.                            All questions were answered, and informed consent                            was obtained. Prior Anticoagulants: The patient has                            taken no previous anticoagulant or antiplatelet                            agents. ASA Grade Assessment: II - A patient with                            mild systemic disease. After reviewing the risks                            and benefits, the patient was deemed in                            satisfactory condition to undergo the procedure.  After obtaining informed consent, the colonoscope                            was passed under direct vision. Throughout the                            procedure, the patient's blood pressure, pulse, and                            oxygen saturations were monitored continuously. The                            Colonoscope was introduced through the anus and                            advanced to the the cecum, identified by                   appendiceal orifice and ileocecal valve. The                            colonoscopy was performed without difficulty. The                            patient tolerated the procedure well. The quality                            of the bowel preparation was good. The ileocecal                            valve, appendiceal orifice, and rectum were                            photographed. Scope In: 4:18:41 PM Scope Out: 5:41:54 PM Scope Withdrawal Time: 1 hour 9 minutes 28 seconds  Total Procedure Duration: 1 hour 23 minutes 13 seconds  Findings:                 The perianal and digital rectal examinations were                            normal.                           A 4 mm ? polyp vs. prominent fold was found in the                            appendiceal orifice. The polyp was flat. The polyp                            was removed with a cold snare. Resection and                            retrieval were complete although was difficult to  see the border of the polyp within the AO. Unclear                            if the patient has had prior appendectomy..                           Three sessile polyps were found in the transverse                            colon. The polyps were 4 to 7 mm in size. These                            polyps were removed with a cold snare. Resection                            and retrieval were complete.                           A large polyp was found in the sigmoid colon (3cm                            or so) around 35cm from the anal verge. The polyp                            was pedunculated with a thick stalk. Upon cecal                            intubation the polp stalk and the head was                            successfully injected with 6 of a 1:10,000 solution                            of epinephrine for drug delivery in preparation for                            polypectomy. Upon withdrawal of the  colonocope, the                            head of the polyp had shrunk. A polyloop was used                            to minimize bleeding risk during polypectomy given                            the large size of the stalk. It was successfully                            placed around the stalk of the polyp in preparation  for polypectomy. Unfortunately when snug on the                            polyp, the polyloop failed to deploy and the wire                            on the handle of the device broke. Unfortunately                            most of the polyloop was within the plastic channel                            of the deployment catheter and it was stuck. I                            elected to use a wire cutter to cut the handle off                            of the polyloop device as it was useless at that                            point in time and then withdrew the colonoscope                            with the polyloop and catheter remaining in the                            patient. Given the head of the polyp was large and                            obscured the view of where the polyloop was                            attached, the colonoscope was placed again and a                            hot snare was used to resect the polyp head over                            the polyloop / catheter. Polypectomy was                            successful. I then placed a the loop cutter to cut                            through the polyloop and catheter itself but it was                            not effective given most of the loop was buried  within the catheter. The colonoscope was then                            withdrawn. I placed a large snare through the                            colonoscope, over the distal end of the polyloop                            catheter outside of the patient, and then placed                             the colonoscope again and guided the snare up the                            polyloop catheter to the polyp stalk. The snare was                            then successfully placed to the base of the stalk                            underneath where the the polyloop attached and the                            distal half of the stalk with the polyloop attached                            was snared off and polyloop / catheter was removed                            from the patient . To prevent bleeding after the                            polypectomy given the large size of the stalk,                            three hemostatic clips were successfully placed                            across the stalk, although it was difficult to                            approximate the edges of the polypectomy site.                            There was no bleeding at the end of the procedure.                            Area distal to the polypectomy site was tattooed  with an injection of Spot (carbon black). This                            process was overall quite tedious to remove the                            defective polyloop from the patient once it lost                            its function.                           Multiple medium-mouthed diverticula were found in                            the sigmoid colon.                           Internal hemorrhoids were found during retroflexion.                           The exam was otherwise without abnormality. Complications:            No immediate complications. Estimated blood loss:                            Minimal. Estimated Blood Loss:     Estimated blood loss was minimal. Impression:               - One 4 mm polyp at the appendiceal orifice,                            removed with a cold snare. Resected and retrieved.                           - Three 4 to 7 mm polyps in the transverse colon,                             removed with a cold snare. Resected and retrieved.                           - One large polyp in the sigmoid colon - treated as                            outlined above, eventually removed but complicated                            by defective polyloop as above.                           - Diverticulosis in the sigmoid colon.                           - Internal hemorrhoids.                           -  The examination was otherwise normal. Recommendation:           - Patient has a contact number available for                            emergencies. The signs and symptoms of potential                            delayed complications were discussed with the                            patient. Return to normal activities tomorrow.                            Written discharge instructions were provided to the                            patient.                           - Resume previous diet.                           - Continue present medications.                           - Await pathology results.                           - No ibuprofen, naproxen, or other non-steroidal                            anti-inflammatory drugs for 2 weeks after polyp                            removal. Remo Lipps P. Ustin Cruickshank, MD 07/21/2019 6:15:31 PM This report has been signed electronically.

## 2019-07-21 NOTE — Progress Notes (Signed)
Pt's states no medical or surgical changes since previsit or office visit.  [place]

## 2019-07-25 ENCOUNTER — Telehealth: Payer: Self-pay

## 2019-07-25 NOTE — Telephone Encounter (Signed)
  Follow up Call-  Call back number 07/21/2019  Post procedure Call Back phone  # [phone]  Permission to leave phone message Yes     Patient questions:  Do you have a fever, pain , or abdominal swelling? No. Pain Score  0 *  Have you tolerated food without any problems? Yes.    Have you been able to return to your normal activities? Yes.    Do you have any questions about your discharge instructions: Diet   No. Medications  No. Follow up visit  No.  Do you have questions or concerns about your Care? No.  Actions: * If pain score is 4 or above: 1. No action needed, pain <4.Have you developed a fever since your procedure? no  2.   Have you had an respiratory symptoms (SOB or cough) since your procedure? no  3.   Have you tested positive for COVID 19 since your procedure no  4.   Have you had any family members/close contacts diagnosed with the COVID 19 since your procedure?  no   If yes to any of these questions please route to Joylene John, RN and Alphonsa Gin, Therapist, sports.

## 2019-07-27 ENCOUNTER — Other Ambulatory Visit: Payer: Self-pay

## 2019-07-27 ENCOUNTER — Ambulatory Visit: Payer: Medicaid Other | Attending: Internal Medicine | Admitting: Pharmacist

## 2019-07-27 DIAGNOSIS — Z114 Encounter for screening for human immunodeficiency virus [HIV]: Secondary | ICD-10-CM

## 2019-07-27 DIAGNOSIS — E782 Mixed hyperlipidemia: Secondary | ICD-10-CM

## 2019-07-27 DIAGNOSIS — I10 Essential (primary) hypertension: Secondary | ICD-10-CM

## 2019-07-27 DIAGNOSIS — E669 Obesity, unspecified: Secondary | ICD-10-CM

## 2019-07-27 DIAGNOSIS — Z0130 Encounter for examination of blood pressure without abnormal findings: Secondary | ICD-10-CM

## 2019-07-27 NOTE — Progress Notes (Signed)
   S:    Patient arrives in good spirits. Presents to the clinic for hypertension evaluation, counseling, and management.  Patient was referred and last seen by Primary Care Provider on 06/22/2019. At that visit, patient was out of meds for a month, refills were sent for patient pick-up.  Patient reports adherence with medications. Took meds this morning prior to appointment. Patient reports lightheadedness, dizziness and headaches 3-4x a week; denies chest pain. Patient reports smoking a cigarette about ~ 1 hour ago. Patient reports BP has been high at home, 147/100s.  Current BP Medications include:  Amlodipine 10 mg daily, HCTZ 25 mg daily  Dietary habits include: replaced salt with Ms. Deliah Boston. Denies caffeine intake. Exercise habits include: denies Family / Social history: - HTN (mother, father) - Tobacco: 0.25 PPD - Denies alcohol intake since last PCP encounter  O:   Home BP readings: 147/100s  Vitals:   07/27/19 0943  BP: 125/85  Pulse: 87    Last 3 Office BP readings: BP Readings from Last 3 Encounters:  07/27/19 125/85  07/21/19 (!) 148/84  07/06/19 (!) 180/110    BMET    Component Value Date/Time   NA 140 02/28/2019 1443   NA 137 11/07/2018 1333   K 4.3 02/28/2019 1443   CL 104 02/28/2019 1443   CO2 25 02/28/2019 1443   GLUCOSE 84 02/28/2019 1443   BUN 11 02/28/2019 1443   BUN 20 11/07/2018 1333   CREATININE 0.78 02/28/2019 1443   CALCIUM 9.5 02/28/2019 1443   GFRNONAA >60 02/28/2019 1443   GFRAA >60 02/28/2019 1443    Renal function: CrCl cannot be calculated (Patient's most recent lab result is older than the maximum 21 days allowed.).  Clinical ASCVD: No  The 10-year ASCVD risk score Mikey Bussing DC Jr., et al., 2013) is: 10%   Values used to calculate the score:     Age: 54 years     Sex: Female     Is Non-Hispanic African American: Yes     Diabetic: No     Tobacco smoker: Yes     Systolic Blood Pressure: 0000000 mmHg     Is BP treated: Yes     HDL  Cholesterol: 52 mg/dL     Total Cholesterol: 216 mg/dL   A/P: Hypertension longstanding diagnosed currently near goal (125/85 mmHg) on current medications. BP Goal = <  130/80 mmHg. Patient is adherent with medications. Discussed possibly adding on another agent for better BP control due to elevated BP readings at home in addition to symptoms of dizziness, lightheadedness, and headaches. However, clinic BP near goal today so patient agreed to write down home BP readings for the next 2 weeks and bring log to next visit.  -Continue Amlodipine 10 mg daily -Continue HCTZ 25 mg daily -Complete future lab orders placed by Dr. Wynetta Emery -Counseled on lifestyle modifications for blood pressure control including reduced dietary sodium, increased exercise, adequate sleep  Results reviewed and written information provided.   Total time in face-to-face counseling 20 minutes.   F/U Clinic Visit in 2 weeks.    Patient seen with: Lorel Monaco, PharmD PGY1 Ambulatory Care Resident [place], PharmD, [place] [phone]

## 2019-07-28 ENCOUNTER — Telehealth: Payer: Self-pay | Admitting: Gastroenterology

## 2019-07-28 LAB — COMPREHENSIVE METABOLIC PANEL
ALT: 13 IU/L (ref 0–32)
AST: 18 IU/L (ref 0–40)
Albumin/Globulin Ratio: 1.3 (ref 1.2–2.2)
Albumin: 4.5 g/dL (ref 3.8–4.9)
Alkaline Phosphatase: 146 IU/L — ABNORMAL HIGH (ref 39–117)
BUN/Creatinine Ratio: 18 (ref 9–23)
BUN: 13 mg/dL (ref 6–24)
Bilirubin Total: 0.4 mg/dL (ref 0.0–1.2)
CO2: 27 mmol/L (ref 20–29)
Calcium: 9.8 mg/dL (ref 8.7–10.2)
Chloride: 97 mmol/L (ref 96–106)
Creatinine, Ser: 0.72 mg/dL (ref 0.57–1.00)
GFR calc Af Amer: 109 mL/min/{1.73_m2} (ref >59)
GFR calc non Af Amer: 95 mL/min/{1.73_m2} (ref >59)
Globulin, Total: 3.6 g/dL (ref 1.5–4.5)
Glucose: 102 mg/dL — ABNORMAL HIGH (ref 65–99)
Potassium: 4.2 mmol/L (ref 3.5–5.2)
Sodium: 138 mmol/L (ref 134–144)
Total Protein: 8.1 g/dL (ref 6.0–8.5)

## 2019-07-28 LAB — CBC
Hematocrit: 41.1 % (ref 34.0–46.6)
Hemoglobin: 14.1 g/dL (ref 11.1–15.9)
MCH: 29.4 pg (ref 26.6–33.0)
MCHC: 34.3 g/dL (ref 31.5–35.7)
MCV: 86 fL (ref 79–97)
Platelets: 424 10*3/uL (ref 150–450)
RBC: 4.8 x10E6/uL (ref 3.77–5.28)
RDW: 13.1 % (ref 11.7–15.4)
WBC: 5 10*3/uL (ref 3.4–10.8)

## 2019-07-28 LAB — LIPID PANEL
Chol/HDL Ratio: 3.1 ratio (ref 0.0–4.4)
Cholesterol, Total: 228 mg/dL — ABNORMAL HIGH (ref 100–199)
HDL: 73 mg/dL (ref >39)
LDL Chol Calc (NIH): 142 mg/dL — ABNORMAL HIGH (ref 0–99)
Triglycerides: 73 mg/dL (ref 0–149)
VLDL Cholesterol Cal: 13 mg/dL (ref 5–40)

## 2019-07-28 LAB — HEMOGLOBIN A1C
Est. average glucose Bld gHb Est-mCnc: 117 mg/dL
Hgb A1c MFr Bld: 5.7 % — ABNORMAL HIGH (ref 4.8–5.6)

## 2019-07-28 LAB — HIV ANTIBODY (ROUTINE TESTING W REFLEX): HIV Screen 4th Generation wRfx: NONREACTIVE

## 2019-07-28 NOTE — Telephone Encounter (Signed)
Pt had a colonoscopy 07/21/19 and inquired when she will have "clamps removed."

## 2019-07-30 ENCOUNTER — Other Ambulatory Visit: Payer: Self-pay | Admitting: Internal Medicine

## 2019-07-30 DIAGNOSIS — R7303 Prediabetes: Secondary | ICD-10-CM | POA: Insufficient documentation

## 2019-07-30 MED ORDER — ATORVASTATIN CALCIUM 10 MG PO TABS: 10.0000 mg | ORAL_TABLET | Freq: Every day | ORAL | 3 refills | Status: DC

## 2019-07-31 NOTE — Telephone Encounter (Signed)
Patient advised that she had 3 hemostatic clips placed during her recent procedure, but they do not require removal.  I answered all of her questions.  She will call back for any additional questions or concerns.

## 2019-08-01 ENCOUNTER — Telehealth: Payer: Self-pay

## 2019-08-01 NOTE — Telephone Encounter (Signed)
Contacted pt to go over lab results pt didn't answer lvm asking pt to give a call back at her earliest convenience  

## 2019-08-10 ENCOUNTER — Other Ambulatory Visit: Payer: Self-pay

## 2019-08-10 ENCOUNTER — Encounter: Payer: Self-pay | Admitting: Pharmacist

## 2019-08-10 ENCOUNTER — Ambulatory Visit: Payer: Self-pay | Attending: Internal Medicine | Admitting: Pharmacist

## 2019-08-10 VITALS — BP 126/83

## 2019-08-10 DIAGNOSIS — I10 Essential (primary) hypertension: Secondary | ICD-10-CM

## 2019-08-10 MED FILL — AMLODIPINE BESYLATE 10 MG T: 10 | 30 days supply | Qty: 30 | Fill #1

## 2019-08-10 MED FILL — ?HYDROCHLOROTHIAZIDE 25MG T: 25 | 30 days supply | Qty: 30 | Fill #1

## 2019-08-10 MED FILL — ?ATORVASTATIN CALCIUM 10MG: 10 | 30 days supply | Qty: 30 | Fill #0

## 2019-08-10 NOTE — Progress Notes (Signed)
   S:    Patient arrives in good spirits. Presents to the clinic for hypertension evaluation, counseling, and management.  Patient was referred and last seen by Primary Care Provider on 06/22/2019. Pharmacy saw her 07/27/19 - BP was 125/85 at that visit.   Current BP Medications include:  Amlodipine 10 mg daily, HCTZ 25 mg daily  Dietary habits include: replaced salt with Ms. Deliah Boston. Denies caffeine intake. Exercise habits include: denies Family / Social history: - HTN (mother, father) - Tobacco: 0.25 PPD - Denies alcohol intake since last PCP encounter  O:  Home BP readings: reports doing better at home: 130s/80s  Vitals:   08/10/19 0837  BP: 126/83    Last 3 Office BP readings: BP Readings from Last 3 Encounters:  08/10/19 126/83  07/27/19 125/85  07/21/19 (!) 148/84    BMET    Component Value Date/Time   NA 138 07/27/2019 0950   K 4.2 07/27/2019 0950   CL 97 07/27/2019 0950   CO2 27 07/27/2019 0950   GLUCOSE 102 (H) 07/27/2019 0950   GLUCOSE 84 02/28/2019 1443   BUN 13 07/27/2019 0950   CREATININE 0.72 07/27/2019 0950   CALCIUM 9.8 07/27/2019 0950   GFRNONAA 95 07/27/2019 0950   GFRAA 109 07/27/2019 0950    Renal function: CrCl cannot be calculated (Unknown ideal weight.).  Clinical ASCVD: No  The 10-year ASCVD risk score Mikey Bussing DC Jr., et al., 2013) is: 7.8%   Values used to calculate the score:     Age: 57 years     Sex: Female     Is Non-Hispanic African American: Yes     Diabetic: No     Tobacco smoker: Yes     Systolic Blood Pressure: 123XX123 mmHg     Is BP treated: Yes     HDL Cholesterol: 73 mg/dL     Total Cholesterol: 228 mg/dL   A/P: Hypertension longstanding currently near goal on current medications. BP Goal = <  130/80 mmHg. Patient is adherent with medications. She did not bring home BP recordings with her today but reports that these have been better. Will continue current regimen. Of note, pt had questions regard recent lab results and  recommendation for statin medication. Benefits discussed with patient; she will pick up her statin today at our pharmacy.  -Continue Amlodipine 10 mg daily -Continue HCTZ 25 mg daily -Counseled on lifestyle modifications for blood pressure control including reduced dietary sodium, increased exercise, adequate sleep  Results reviewed and written information provided.   Total time in face-to-face counseling 20 minutes.   F/U Clinic Visit with PCP.   Benard Halsted, PharmD, [place] [phone]

## 2019-09-22 ENCOUNTER — Ambulatory Visit: Payer: No Typology Code available for payment source | Admitting: Internal Medicine

## 2019-11-02 MED FILL — HYDROCHLOROTHIAZIDE 25 MG T: 25 | 30 days supply | Qty: 30 | Fill #2

## 2019-11-02 MED FILL — ?AMLODIPINE BESYL 10MG TABL: 10 | 30 days supply | Qty: 30 | Fill #2

## 2019-11-23 MED FILL — HYDROCHLOROTHIAZIDE 25 MG T: 25 | 30 days supply | Qty: 30 | Fill #2

## 2019-11-23 MED FILL — AMLODIPINE BESYLATE 10 MG T: 10 | 30 days supply | Qty: 30 | Fill #2

## 2020-05-15 MED FILL — AMLODIPINE BESYLATE 10 MG T: 10 | 30 days supply | Qty: 30 | Fill #3

## 2020-05-15 MED FILL — HYDROCHLOROTHIAZIDE 25 MG T: 25 | 30 days supply | Qty: 30 | Fill #3

## 2020-05-15 MED FILL — ATORVASTATIN 10 MG TABLET: 10 | 30 days supply | Qty: 30 | Fill #1

## 2020-06-13 ENCOUNTER — Encounter: Payer: Self-pay | Admitting: Gastroenterology

## 2020-10-24 ENCOUNTER — Other Ambulatory Visit: Payer: Self-pay | Admitting: Internal Medicine

## 2020-10-24 DIAGNOSIS — I10 Essential (primary) hypertension: Secondary | ICD-10-CM

## 2020-10-24 NOTE — Telephone Encounter (Signed)
Notes to clinic: Patient has appt on 8/23/20222 Review for enough medication until that appt    Requested Prescriptions  Pending Prescriptions Disp Refills   hydrochlorothiazide (HYDRODIURIL) 25 MG tablet 90 tablet 2    Sig: Take 1 tablet (25 mg total) by mouth daily.      Cardiovascular: Diuretics - Thiazide Failed - 10/24/2020 10:50 AM      Failed - Ca in normal range and within 360 days    Calcium  Date Value Ref Range Status  07/27/2019 9.8 8.7 - 10.2 mg/dL Final          Failed - Cr in normal range and within 360 days    Creatinine, Ser  Date Value Ref Range Status  07/27/2019 0.72 0.57 - 1.00 mg/dL Final          Failed - K in normal range and within 360 days    Potassium  Date Value Ref Range Status  07/27/2019 4.2 3.5 - 5.2 mmol/L Final          Failed - Na in normal range and within 360 days    Sodium  Date Value Ref Range Status  07/27/2019 138 134 - 144 mmol/L Final          Failed - Valid encounter within last 6 months    Recent Outpatient Visits           1 year ago Essential hypertension   [place], Jarome Matin, RPH-CPP   1 year ago Screening for HIV (human immunodeficiency virus)   [place], Stephen L, RPH-CPP   1 year ago Essential hypertension   [place], Deborah B, MD   1 year ago Essential hypertension   [place], Stephen L, RPH-CPP   1 year ago Essential hypertension   [place], Stephen L, RPH-CPP       Future Appointments             In 2 months Ladell Pier, MD [place] - Last BP in normal range    BP Readings from Last 1 Encounters:  08/10/19 126/83            amLODipine (NORVASC) 10 MG tablet 90 tablet 2    Sig: Take 1 tablet (10 mg total) by  mouth daily. Please make PCP appointment.      Cardiovascular:  Calcium Channel Blockers Failed - 10/24/2020 10:50 AM      Failed - Valid encounter within last 6 months    Recent Outpatient Visits           1 year ago Essential hypertension   [place], RPH-CPP   1 year ago Screening for HIV (human immunodeficiency virus)   [place], Stephen L, RPH-CPP   1 year ago Essential hypertension   [place], Deborah B, MD   1 year ago Essential hypertension   [place], RPH-CPP   1 year ago Essential hypertension   [employer], Stephen L, RPH-CPP       Future Appointments  In 2 months Ladell Pier, MD [place] BP in normal range    BP Readings from Last 1 Encounters:  08/10/19 126/83

## 2020-10-24 NOTE — Telephone Encounter (Signed)
Medication Refill - Medication: hydrochlorothiazide (HYDRODIURIL) 25 MG  and amLODipine (NORVASC) 10 MG tablet    Has the patient contacted their pharmacy? Yes.    (Agent: If yes, when and what did the pharmacy advise?) Contact PCP due to no more refills, patient scheduled follow up with PCP for 12/24/2020   Preferred Pharmacy (with phone number or street name):  [facility] [place] Phone:  [phone]  Fax:  [phone]      Agent: Please be advised that RX refills may take up to 3 business days. We ask that you follow-up with your pharmacy.

## 2020-12-17 ENCOUNTER — Other Ambulatory Visit: Payer: Self-pay | Admitting: Internal Medicine

## 2020-12-17 ENCOUNTER — Other Ambulatory Visit: Payer: Self-pay

## 2020-12-17 DIAGNOSIS — I10 Essential (primary) hypertension: Secondary | ICD-10-CM

## 2020-12-17 MED ORDER — HYDROCHLOROTHIAZIDE 25 MG PO TABS
25.0000 mg | ORAL_TABLET | Freq: Every day | ORAL | 0 refills | Status: DC
  Filled 2020-12-17: qty 10, 10d supply, fill #0

## 2020-12-17 MED ORDER — AMLODIPINE BESYLATE 10 MG PO TABS
10.0000 mg | ORAL_TABLET | Freq: Every day | ORAL | 0 refills | Status: DC
  Filled 2020-12-17: qty 10, 10d supply, fill #0

## 2020-12-17 NOTE — Telephone Encounter (Signed)
Medication Refill - Medication:  hydrochlorothiazide (HYDRODIURIL) 25 MG tablet  amLODipine (NORVASC) 10 MG tablet  *Pt does have appt on 08/23, but having episodes and needs medication*   Has the patient contacted their pharmacy? No.  Preferred Pharmacy (with phone number or street name):  [facility] [place]  Phone:  [phone] Fax:  [phone]  Agent: Please be advised that RX refills may take up to 3 business days. We ask that you follow-up with your pharmacy.

## 2020-12-17 NOTE — Telephone Encounter (Signed)
Patient has not been seen since 08/2019. Labs last resulted 07/27/2019. Refills will have to be approved by PCP if appropriate.

## 2020-12-17 NOTE — Telephone Encounter (Signed)
Notes to clinic:    Pt does have appt on 08/23, but having episodes and needs medication   Requested Prescriptions  Pending Prescriptions Disp Refills   amLODipine (NORVASC) 10 MG tablet 90 tablet 2    Sig: Take 1 tablet (10 mg total) by mouth daily. Please make PCP appointment.     Cardiovascular:  Calcium Channel Blockers Failed - 12/17/2020 10:55 AM      Failed - Valid encounter within last 6 months    Recent Outpatient Visits           1 year ago Essential hypertension   [place], Jarome Matin, RPH-CPP   1 year ago Screening for HIV (human immunodeficiency virus)   [place], Stephen L, RPH-CPP   1 year ago Essential hypertension   [place], Deborah B, MD   1 year ago Essential hypertension   [place], Stephen L, RPH-CPP   1 year ago Essential hypertension   [place], Stephen L, RPH-CPP       Future Appointments             In 1 week Ladell Pier, MD [place] BP in normal range    BP Readings from Last 1 Encounters:  08/10/19 126/83           hydrochlorothiazide (HYDRODIURIL) 25 MG tablet 90 tablet 2    Sig: Take 1 tablet (25 mg total) by mouth daily.     Cardiovascular: Diuretics - Thiazide Failed - 12/17/2020 10:55 AM      Failed - Ca in normal range and within 360 days    Calcium  Date Value Ref Range Status  07/27/2019 9.8 8.7 - 10.2 mg/dL Final          Failed - Cr in normal range and within 360 days    Creatinine, Ser  Date Value Ref Range Status  07/27/2019 0.72 0.57 - 1.00 mg/dL Final          Failed - K in normal range and within 360 days    Potassium  Date Value Ref Range Status  07/27/2019 4.2 3.5 - 5.2 mmol/L Final          Failed - Na in normal range and  within 360 days    Sodium  Date Value Ref Range Status  07/27/2019 138 134 - 144 mmol/L Final          Failed - Valid encounter within last 6 months    Recent Outpatient Visits           1 year ago Essential hypertension   [place], Jarome Matin, RPH-CPP   1 year ago Screening for HIV (human immunodeficiency virus)   [place], Stephen L, RPH-CPP   1 year ago Essential hypertension   [place], Deborah B, MD   1 year ago Essential hypertension   [place], Jarome Matin, RPH-CPP   1 year ago Essential hypertension   [place], Stephen L, RPH-CPP       Future Appointments  In 1 week Ladell Pier, MD [place] BP in normal range    BP Readings from Last 1 Encounters:  08/10/19 126/83

## 2020-12-18 ENCOUNTER — Other Ambulatory Visit: Payer: Self-pay

## 2020-12-19 ENCOUNTER — Other Ambulatory Visit: Payer: Self-pay

## 2020-12-24 ENCOUNTER — Other Ambulatory Visit: Payer: Self-pay

## 2020-12-24 ENCOUNTER — Encounter: Payer: Self-pay | Admitting: Internal Medicine

## 2020-12-24 ENCOUNTER — Ambulatory Visit: Payer: Self-pay | Attending: Internal Medicine | Admitting: Internal Medicine

## 2020-12-24 VITALS — BP 153/102 | HR 95 | Resp 16 | Ht 59.5 in | Wt 189.2 lb

## 2020-12-24 DIAGNOSIS — E782 Mixed hyperlipidemia: Secondary | ICD-10-CM

## 2020-12-24 DIAGNOSIS — D126 Benign neoplasm of colon, unspecified: Secondary | ICD-10-CM

## 2020-12-24 DIAGNOSIS — R7303 Prediabetes: Secondary | ICD-10-CM

## 2020-12-24 DIAGNOSIS — Z2821 Immunization not carried out because of patient refusal: Secondary | ICD-10-CM

## 2020-12-24 DIAGNOSIS — I10 Essential (primary) hypertension: Secondary | ICD-10-CM

## 2020-12-24 DIAGNOSIS — F17200 Nicotine dependence, unspecified, uncomplicated: Secondary | ICD-10-CM

## 2020-12-24 DIAGNOSIS — Z1231 Encounter for screening mammogram for malignant neoplasm of breast: Secondary | ICD-10-CM

## 2020-12-24 DIAGNOSIS — E669 Obesity, unspecified: Secondary | ICD-10-CM

## 2020-12-24 MED ORDER — HYDROCHLOROTHIAZIDE 25 MG PO TABS
25.0000 mg | ORAL_TABLET | Freq: Every day | ORAL | 1 refills | Status: DC
Start: 2020-12-24 — End: 2021-07-31
  Filled 2020-12-24: qty 30, 30d supply, fill #0

## 2020-12-24 MED ORDER — AMLODIPINE BESYLATE 10 MG PO TABS
10.0000 mg | ORAL_TABLET | Freq: Every day | ORAL | 1 refills | Status: DC
  Filled 2020-12-24: qty 30, 30d supply, fill #0

## 2020-12-24 MED ORDER — LOSARTAN POTASSIUM 25 MG PO TABS
25.0000 mg | ORAL_TABLET | Freq: Every day | ORAL | 4 refills | Status: DC
  Filled 2020-12-24: qty 30, 30d supply, fill #0

## 2020-12-24 MED ORDER — ATORVASTATIN CALCIUM 10 MG PO TABS
ORAL_TABLET | Freq: Every day | ORAL | 1 refills | Status: DC
Start: 2020-12-24 — End: 2021-08-11
  Filled 2020-12-24: qty 30, 30d supply, fill #0

## 2020-12-24 NOTE — Progress Notes (Signed)
Patient ID: Marie Ferguson, female    DOB: 1966/01/07  MRN: [id number]  CC: Hypertension   Subjective: Marie Ferguson is a 55 y.o. female who presents for chronic ds management Her concerns today include:  HTN, HL, hypertensive retinopathy BL with optic disc atrophy on RT (Dr. Schuyler Amor), obesity, tob dep.  Patient was last seen in March 2021.  HYPERTENSION Currently taking: see medication list - Was out of meds for 2 mths until 5 days ago. Norvasc and HCTZ.   Med Adherence: '[x]'$  Yes    '[]'$  No Medication side effects: '[]'$  Yes    '[x]'$  No Adherence with salt restriction: '[]'$  Yes    '[x]'$  No Home Monitoring?: '[x]'$  Yes    '[]'$  No Monitoring Frequency:  once a wk.   Home BP results range: before restarting meds BP 200s/?180.  Has not checked recently SOB? '[]'$  Yes    '[x]'$  No Chest Pain?: '[]'$  Yes    '[x]'$  No Leg swelling?: '[]'$  Yes    '[x]'$  No Headaches?: '[x]'$  Yes -occasionally    '[]'$  No Dizziness? '[]'$  Yes    '[x]'$  No Comments:   HL:  taking and tolerating Lipitor  Tob dep:  still smoking cigarettes.  2 cigarettes/day. Feels she needs to quit but not ready to do so.  Obesity/preDM:  does a lot of walking at work.  Works 10 hr shift at [employer].  No exercise outside of work.  She feels she does okay with her eating habits.  HM: Due for repeat colonoscopy.  She had colonoscopy done last year with several polyps removed 1 of which was large.  Dr. Havery Moros recommended repeat colonoscopy in 6 months.  She is uninsured.  Due for mammogram screening.  Patient declines pneumonia vaccine, Tdap, Shingrix and COVID vaccines. 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     No current facility-administered medications on file prior to visit.    No Known Allergies  Social History   Socioeconomic History   Marital status: Single    Spouse name: Not on file   Number of children: 3   Years of education: 1 yr community college   Highest education level: Some college, no degree  Occupational History   Occupation: IT sales professional  Tobacco Use   Smoking status: Every Day    Packs/day: 0.00    Types: Cigarettes   Smokeless tobacco: Never   Tobacco comments:    1-3 cigarettes per day  Vaping Use   Vaping Use: Never used  Substance and Sexual Activity   Alcohol use: Yes    Comment: rarely   Drug use: Yes    Types: Marijuana    Comment: 1x a week   Sexual activity: Not Currently  Other Topics Concern   Not on file  Social History Narrative   Not on file   Social Determinants of Radio broadcast assistant  Strain: Not on file  Food Insecurity: Not on file  Transportation Needs: Not on file  Physical Activity: Not on file  Stress: Not on file  Social Connections: Not on file  Intimate Partner Violence: Not on file    Family History  Problem Relation Age of Onset   Hypertension Mother    Hypertension Father    Heart attack Brother    Diabetes Maternal Aunt        x2   Diabetes Maternal Uncle     Past Surgical History:  Procedure Laterality Date   ABDOMINAL HYSTERECTOMY     BREAST LUMPECTOMY WITH RADIOACTIVE SEED LOCALIZATION Left 03/03/2019   Procedure: LEFT BREAST LUMPECTOMY X 2 WITH 2 RADIOACTIVE SEED LOCALIZATION;  Surgeon: Jovita Kussmaul, MD;  Location: [place];  Service: General;  Laterality: Left;   CESAREAN SECTION     x 3   COLONOSCOPY   07/21/2019   POLYPECTOMY  07/21/2019    ROS: Negative except as stated above  PHYSICAL EXAM: BP (!) 153/102   Pulse 95   Resp 16   Ht 4' 11.5" (1.511 m)   Wt 189 lb 3.2 oz (85.8 kg)   SpO2 97%   BMI 37.57 kg/m   Wt Readings from Last 3 Encounters:  12/24/20 189 lb 3.2 oz (85.8 kg)  07/21/19 192 lb (87.1 kg)  07/06/19 192 lb 8 oz (87.3 kg)  Repeat blood pressure 165/106  Physical Exam  General appearance - alert, well appearing, and in no distress Mental status - normal mood, behavior, speech, dress, motor activity, and thought processes Neck - supple, no significant adenopathy Chest - clear to auscultation, no wheezes, rales or rhonchi, symmetric air entry Heart - normal rate, regular rhythm, normal S1, S2, no murmurs, rubs, clicks or gallops Extremities - peripheral pulses normal, no pedal edema, no clubbing or cyanosis   CMP Latest Ref Rng & Units 07/27/2019 02/28/2019 11/07/2018  Glucose 65 - 99 mg/dL 102(H) 84 89  BUN 6 - 24 mg/dL '13 11 20  '$ Creatinine 0.57 - 1.00 mg/dL 0.72 0.78 0.91  Sodium 134 - 144 mmol/L 138 140 137  Potassium 3.5 - 5.2 mmol/L 4.2 4.3 4.8  Chloride 96 - 106 mmol/L 97 104 97  CO2 20 - 29 mmol/L '27 25 27  '$ Calcium 8.7 - 10.2 mg/dL 9.8 9.5 9.5  Total Protein 6.0 - 8.5 g/dL 8.1 - 8.1  Total Bilirubin 0.0 - 1.2 mg/dL 0.4 - 0.3  Alkaline Phos 39 - 117 IU/L 146(H) - 134(H)  AST 0 - 40 IU/L 18 - 18  ALT 0 - 32 IU/L 13 - 18   Lipid Panel     Component Value Date/Time   CHOL 228 (H) 07/27/2019 0950   TRIG 73 07/27/2019 0950   HDL 73 07/27/2019 0950   CHOLHDL 3.1 07/27/2019 0950   LDLCALC 142 (H) 07/27/2019 0950    CBC    Component Value Date/Time   WBC 5.0 07/27/2019 0950   WBC 4.6 10/29/2018 1253   RBC 4.80 07/27/2019 0950   RBC 4.72 10/29/2018 1253   HGB 14.1 07/27/2019 0950   HCT 41.1 07/27/2019 0950   PLT 424 07/27/2019 0950   MCV 86 07/27/2019 0950   MCH 29.4 07/27/2019 0950   MCH 29.2 10/29/2018 1253   MCHC 34.3 07/27/2019 0950    MCHC 33.1 10/29/2018 1253   RDW 13.1 07/27/2019 0950   LYMPHSABS 1.4 10/29/2018 1253   MONOABS 0.5 10/29/2018 1253   EOSABS 0.1 10/29/2018  1253   BASOSABS 0.0 10/29/2018 1253    ASSESSMENT AND PLAN: 1. Essential hypertension Not at goal.  Continue amlodipine and hydrochlorothiazide.  Add Cozaar.  Check baseline blood test today including chemistry. -DASH diet discussed and encouraged. -Follow-up with clinical pharmacist in 2 weeks for repeat blood pressure check. - CBC - Comprehensive metabolic panel - amLODipine (NORVASC) 10 MG tablet; Take 1 tablet (10 mg total) by mouth daily.  Dispense: 90 tablet; Refill: 1 - hydrochlorothiazide (HYDRODIURIL) 25 MG tablet; Take 1 tablet (25 mg total) by mouth daily.  Dispense: 90 tablet; Refill: 1 - losartan (COZAAR) 25 MG tablet; Take 1 tablet (25 mg total) by mouth daily.  Dispense: 30 tablet; Refill: 4  2. Hyperlipidemia, mixed Continue atorvastatin.  Lipid profile to be checked today. - Lipid panel - atorvastatin (LIPITOR) 10 MG tablet; TAKE 1 TABLET (10 MG TOTAL) BY MOUTH DAILY.  Dispense: 90 tablet; Refill: 1  3. Tobacco dependence Advised to quit.  Discussed health risks associated with smoking.  Patient not ready to give a trial of quitting.  4. Obesity (BMI 30-39.9) 5. Prediabetes Encourage regular exercise at least 3 to 5 days a week for 30 minutes. Dietary counseling given.  Advised to eliminate sugary drinks from the diet, eat more lean white meat instead of red meat, cut back on portion sizes of white carbohydrates and incorporate fresh fruits and vegetables into the diet - Hemoglobin A1c  6. Adenomatous polyp of colon, unspecified part of colon Advised to apply for the orange card/cone discount card.  Will refer back to Dr. Havery Moros for repeat colonoscopy - Ambulatory referral to Gastroenterology  7. Encounter for screening mammogram for malignant neoplasm of breast - MM Digital Screening; Future  8.  COVID-vaccine  declined.  Patient was given the opportunity to ask questions.  Patient verbalized understanding of the plan and was able to repeat key elements of the plan.   Orders Placed This Encounter  Procedures   MM Digital Screening   CBC   Comprehensive metabolic panel   Lipid panel   Hemoglobin A1c   Ambulatory referral to Gastroenterology     Requested Prescriptions   Signed Prescriptions Disp Refills   amLODipine (NORVASC) 10 MG tablet 90 tablet 1    Sig: Take 1 tablet (10 mg total) by mouth daily.   hydrochlorothiazide (HYDRODIURIL) 25 MG tablet 90 tablet 1    Sig: Take 1 tablet (25 mg total) by mouth daily.   atorvastatin (LIPITOR) 10 MG tablet 90 tablet 1    Sig: TAKE 1 TABLET (10 MG TOTAL) BY MOUTH DAILY.   losartan (COZAAR) 25 MG tablet 30 tablet 4    Sig: Take 1 tablet (25 mg total) by mouth daily.    Return in about 3 months (around 03/26/2021) for Give appt with [facility] in 2-3 wks for BP recheck.  Karle Plumber, MD, FACP

## 2020-12-25 ENCOUNTER — Other Ambulatory Visit: Payer: Self-pay

## 2020-12-25 LAB — COMPREHENSIVE METABOLIC PANEL
ALT: 19 IU/L (ref 0–32)
AST: 23 IU/L (ref 0–40)
Albumin/Globulin Ratio: 1.2 (ref 1.2–2.2)
Albumin: 4.9 g/dL (ref 3.8–4.9)
Alkaline Phosphatase: 147 IU/L — ABNORMAL HIGH (ref 44–121)
BUN/Creatinine Ratio: 22 (ref 9–23)
BUN: 20 mg/dL (ref 6–24)
Bilirubin Total: 0.6 mg/dL (ref 0.0–1.2)
CO2: 23 mmol/L (ref 20–29)
Calcium: 10.1 mg/dL (ref 8.7–10.2)
Chloride: 93 mmol/L — ABNORMAL LOW (ref 96–106)
Creatinine, Ser: 0.93 mg/dL (ref 0.57–1.00)
Globulin, Total: 4.1 g/dL (ref 1.5–4.5)
Glucose: 99 mg/dL (ref 65–99)
Potassium: 4.4 mmol/L (ref 3.5–5.2)
Sodium: 134 mmol/L (ref 134–144)
Total Protein: 9 g/dL — ABNORMAL HIGH (ref 6.0–8.5)
eGFR: 72 mL/min/{1.73_m2} (ref >59)

## 2020-12-25 LAB — CBC
Hematocrit: 47 % — ABNORMAL HIGH (ref 34.0–46.6)
Hemoglobin: 16.2 g/dL — ABNORMAL HIGH (ref 11.1–15.9)
MCH: 29.2 pg (ref 26.6–33.0)
MCHC: 34.5 g/dL (ref 31.5–35.7)
MCV: 85 fL (ref 79–97)
Platelets: 409 10*3/uL (ref 150–450)
RBC: 5.55 x10E6/uL — ABNORMAL HIGH (ref 3.77–5.28)
RDW: 12.3 % (ref 11.7–15.4)
WBC: 5.9 10*3/uL (ref 3.4–10.8)

## 2020-12-25 LAB — HEMOGLOBIN A1C
Est. average glucose Bld gHb Est-mCnc: 114 mg/dL
Hgb A1c MFr Bld: 5.6 % (ref 4.8–5.6)

## 2020-12-25 LAB — LIPID PANEL
Chol/HDL Ratio: 5.1 ratio — ABNORMAL HIGH (ref 0.0–4.4)
Cholesterol, Total: 255 mg/dL — ABNORMAL HIGH (ref 100–199)
HDL: 50 mg/dL (ref >39)
LDL Chol Calc (NIH): 187 mg/dL — ABNORMAL HIGH (ref 0–99)
Triglycerides: 101 mg/dL (ref 0–149)
VLDL Cholesterol Cal: 18 mg/dL (ref 5–40)

## 2020-12-31 ENCOUNTER — Telehealth: Payer: Self-pay

## 2020-12-31 NOTE — Telephone Encounter (Signed)
Contacted pt to go over lab results pt is aware and doesn't have any questions or concerns 

## 2021-01-12 IMAGING — MG DIGITAL DIAGNOSTIC BILATERAL MAMMOGRAM WITH TOMO AND CAD
8 of 18 series · 8 of 40 positions shown · non-contrast
Comparison: None

CLINICAL DATA: Patient presents for pain within the left breast.

EXAM:
DIGITAL DIAGNOSTIC BILATERAL MAMMOGRAM WITH CAD AND TOMO
ULTRASOUND LEFT BREAST

[R ML (1 of 2)]
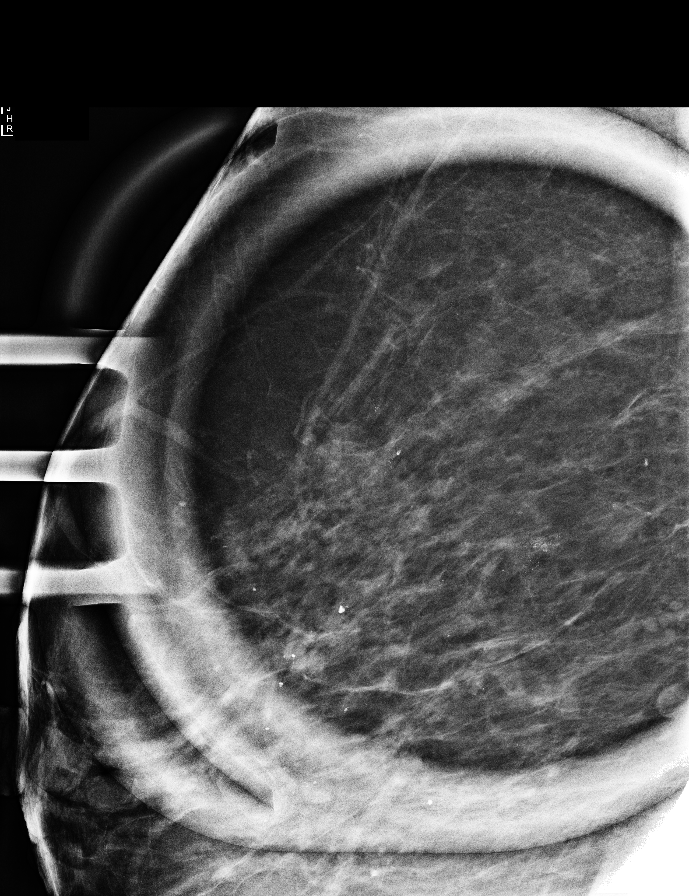

[L ML (1 of 2)]
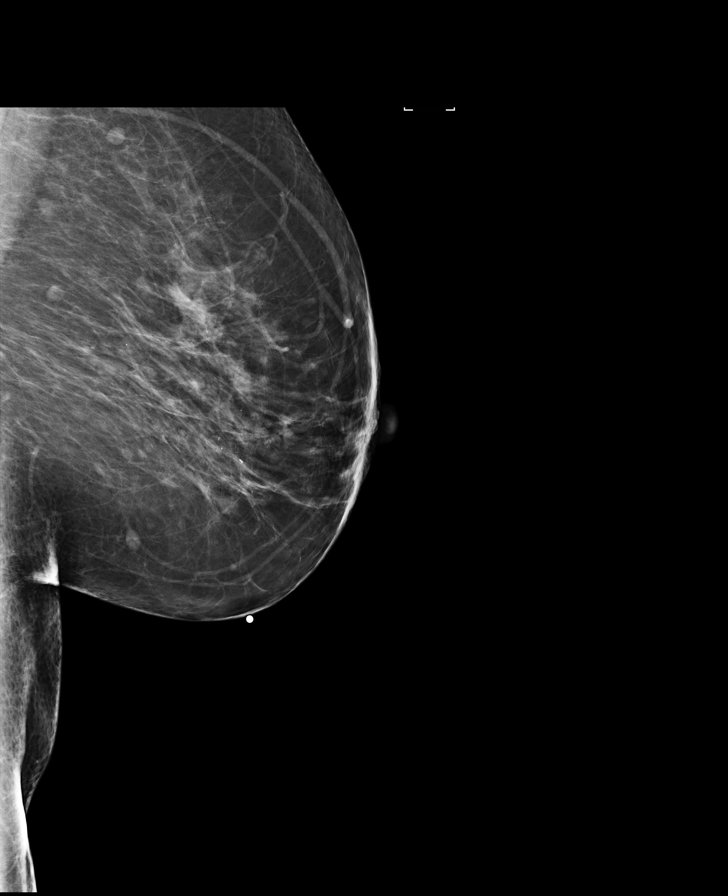

[L ML (2 of 2)]
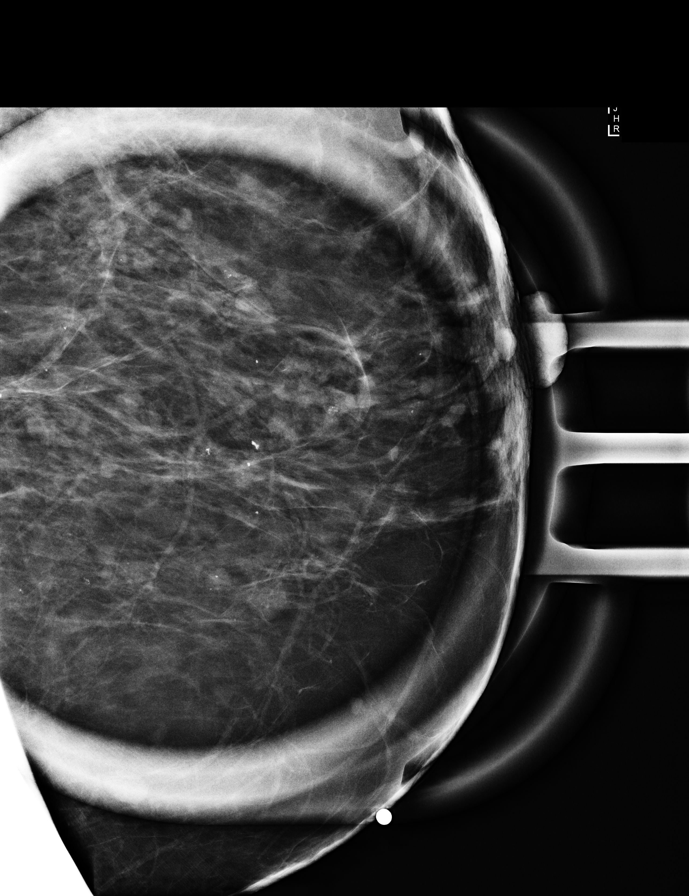

[R CC]
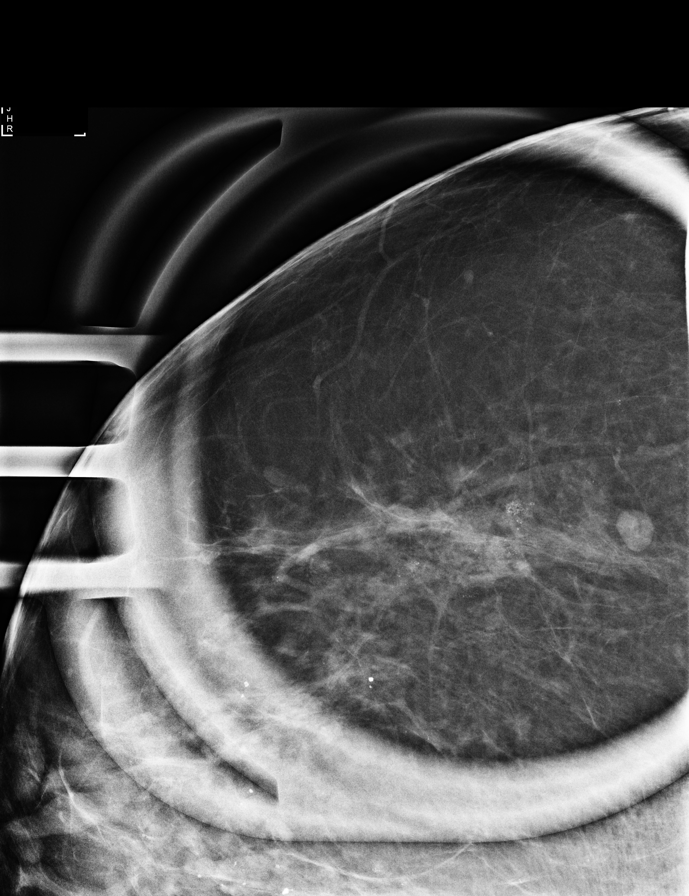

[R ML (2 of 2)]
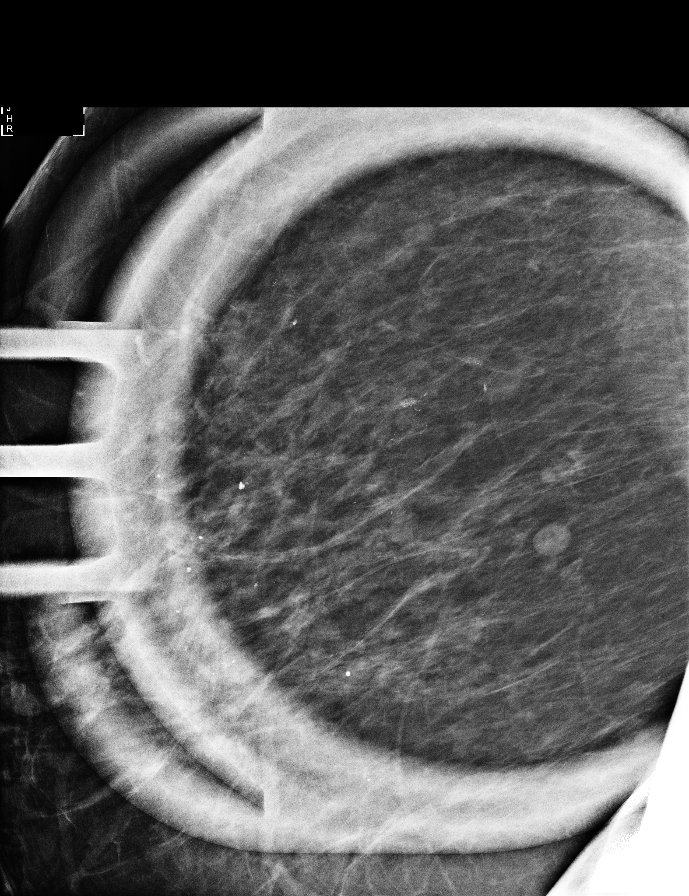

[L CC]
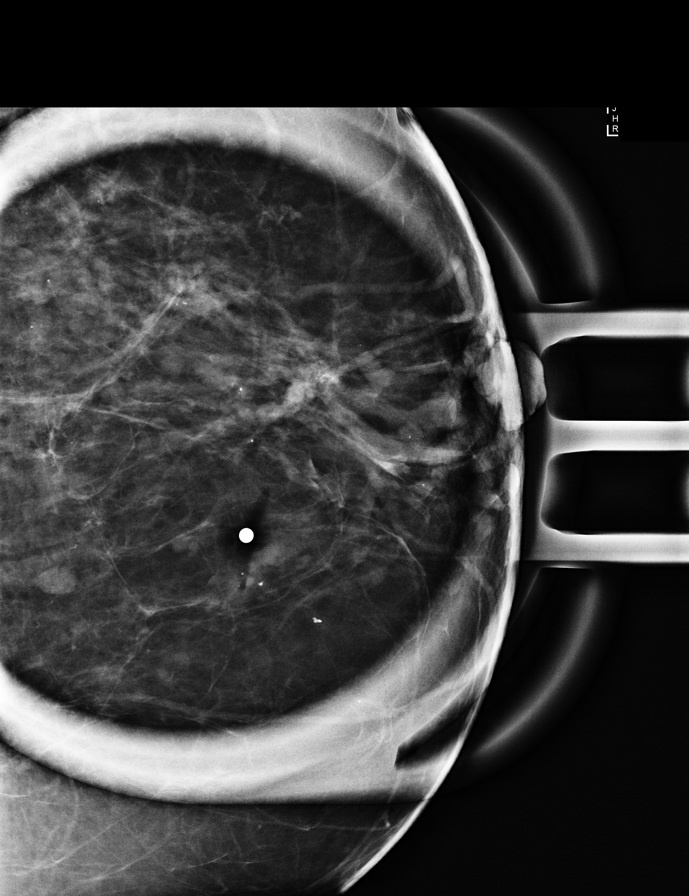

[L CC synth-2D]
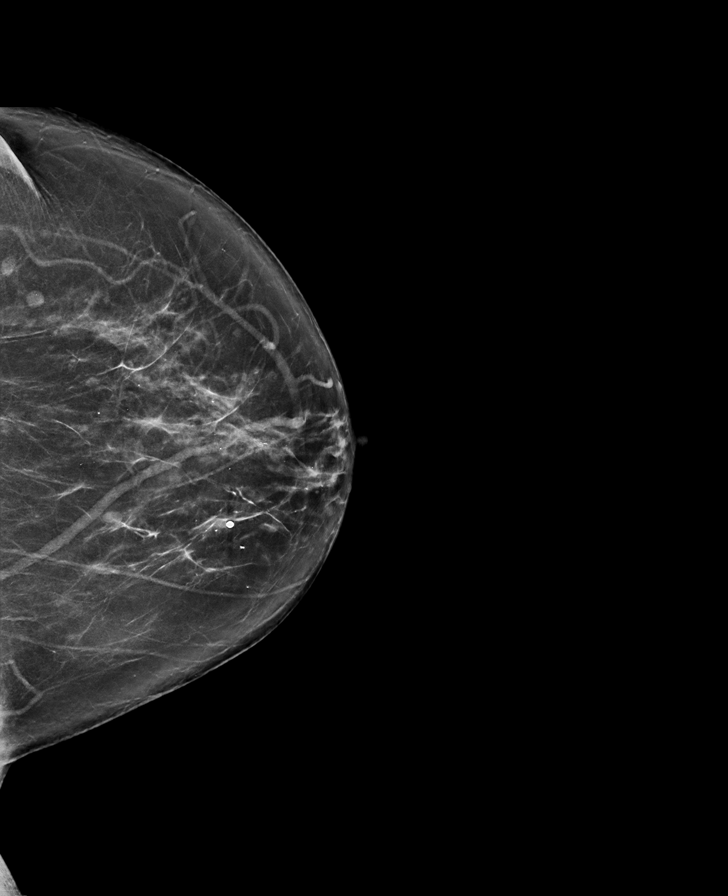

[R CC synth-2D]
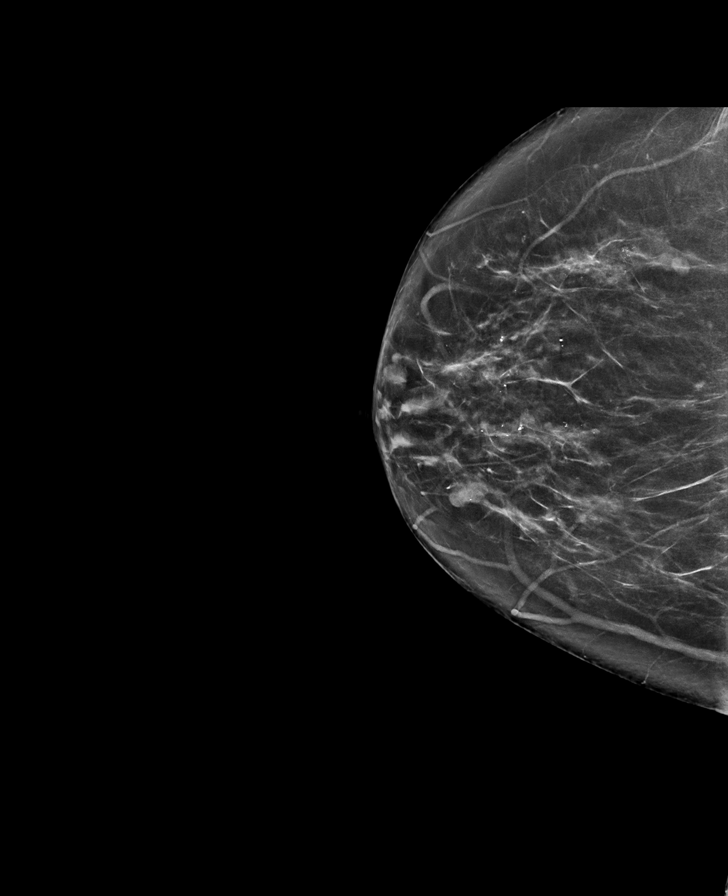

[8 of 40 positions shown; findings below may reference images not displayed]

ACR Breast Density Category c: The breast tissue is heterogeneously
dense, which may obscure small masses.
FINDINGS: Bilateral oval circumscribed masses are demonstrated. There are
loosely grouped coarse calcifications throughout the left and right
breast.

Additionally within the outer aspect of the right breast there is a
2.3 cm group of amorphous calcifications further evaluated with
magnification CC and true lateral views.

Within the central retroareolar left breast there are loosely
grouped round and punctate calcifications, further evaluated with
magnification CC and true lateral views.

Mammographic images were processed with CAD.

Targeted ultrasound is performed, showing dense tissue left breast 6
o'clock position 5 cm from nipple at the site of focal tenderness.

Additionally, within the left breast 6:30 o'clock 3 cm from nipple
there is a lobular 4 x 4 x 7 mm hypoechoic mass.

No left axillary adenopathy.
IMPRESSION: 1. Suspicious amorphous calcifications within the outer aspect of
the right breast middle depth.
2. Indeterminate left breast mass 6:30 o'clock.
3. Probably probably benign calcifications retroareolar left breast.

RECOMMENDATION:
1. Stereotactic guided core needle biopsy amorphous calcifications
outer right breast.
2. Ultrasound-guided core needle biopsy left breast mass 6:30
o'clock.
3. If these demonstrate benign pathology, recommend six-month
follow-up bilateral diagnostic mammography to reassess left breast
retroareolar calcifications.

I have discussed the findings and recommendations with the patient.
Results were also provided in writing at the conclusion of the
visit. If applicable, a reminder letter will be sent to the patient
regarding the next appointment.

BI-RADS CATEGORY  4: Suspicious.

## 2021-01-14 ENCOUNTER — Ambulatory Visit: Payer: No Typology Code available for payment source | Admitting: Pharmacist

## 2021-02-03 ENCOUNTER — Ambulatory Visit: Payer: Self-pay | Admitting: *Deleted

## 2021-02-03 NOTE — Telephone Encounter (Signed)
I returned pt's call.   She had called in concerned that she had a seizure on Saturday 02/01/2021.   She was walking into the building where she lives and had just swiped her card to get in and she doesn't remember anything else except when she came to the paramedics and police were there with her.   She never fell to the floor.   The people there said I was standing there shaking and my eyes were crazy looking so they called 911.   She did not let them take her to the ED.   "I realize now I should have gone but I was in shock and just signed the paper that I wasn't going to go".   No history of seizures.   Doesn't remember anything that occurred during the seizure.     See triage notes.  There wasn't a protocol that fit the description of her seizure.   I made her an appt with Carrolyn Meiers, NP with [facility] for 02/11/2021 at 10:50.    I instructed her to go to the ED if she has another seizures.   She was agreeable to this plan.

## 2021-02-03 NOTE — Telephone Encounter (Signed)
Reason for Disposition  [1] Seizure lasting < 5 minutes AND [2] history of prior seizure(s) AND [3] taking anticonvulsants    No history of seizures.  Answer Assessment - Initial Assessment Questions 1. ONSET: "How long did the seizure last?" (e.g., seconds, minutes)      I returned pt's call.   I don't remember nothing.  I was walking inside the building I swiped my card and all that I can remember.    When I came to the paramedic and police were there.    People and paramedic said I was standing there shaking and my eyes were funny looking so they called the EMS.   I didn't let them take me to the hospital.  I was in shock afterwards so I signed the paper not to go to the ED.    2. CONTENT: "Describe what happened during the seizure. Did the body become stiff? Was there any jerking?"      Don't remember anything about it at all.   I never fell to the floor.  I was just standing there. 3. CIRCUMSTANCE: "What was the person doing when the seizure began?"      Walking into the building where she lives and swiped her card and that's all she remembered.   4. MENTAL STATUS: "Does the person know who they are, who you are, and where they are?"      Yes fine today.   It happened on Saturday. 5. PRIOR SEIZURES: "Has the person had a seizure (convulsion) before?" If Yes, ask: "When was the last time?" and "What happened last time?"     No 6. EPILEPSY: "Does the person have epilepsy?" Note: Check for medical ID bracelet.     No 7. MEDICINES: "Does the person take anticonvulsant medications?" (e.g., Yes, No; compliance, any recent changes)     No 8. INJURY: "Did the person hurt himself during the seizure?" (e.g., head, tongue)     No 9. OTHER SYMPTOMS: "Are there any other symptoms?" (e.g., fever, headache)     Not now.   Didn't remember having dizziness or a headache when she came to.   10. PREGNANCY: "Is there any chance you are pregnant?" "When was your last menstrual period?"       Not asked due to  age  Protocols used: [facility]

## 2021-02-05 ENCOUNTER — Encounter (HOSPITAL_COMMUNITY): Payer: Self-pay | Admitting: Oncology

## 2021-02-05 ENCOUNTER — Other Ambulatory Visit: Payer: Self-pay

## 2021-02-05 ENCOUNTER — Emergency Department (HOSPITAL_COMMUNITY)
Admission: EM | Admit: 2021-02-05 | Discharge: 2021-02-05 | Disposition: A | Discharge status: Home / Self Care | Payer: No Typology Code available for payment source | Attending: Emergency Medicine | Admitting: Emergency Medicine

## 2021-02-05 DIAGNOSIS — Z5321 Procedure and treatment not carried out due to patient leaving prior to being seen by health care provider: Secondary | ICD-10-CM | POA: Insufficient documentation

## 2021-02-05 DIAGNOSIS — R569 Unspecified convulsions: Secondary | ICD-10-CM | POA: Insufficient documentation

## 2021-02-05 NOTE — ED Triage Notes (Signed)
Pt reports that she had seizure activity on Saturday night.  Pt does not remember the event states she was told that she was walking, stopped began shaking, crying and was unable to speak.  EMS to scene however pt did not transport to ED d/t work schedule.  Pt reports one other instance approximately 1 year ago, has not had follow up w/ neurology.

## 2021-02-05 NOTE — ED Notes (Signed)
Pt called x3 for vital signs assessment. Currently not present or visible in ED lobby. Did not respond to name call.

## 2021-02-05 NOTE — ED Notes (Signed)
Pt was called x1 for vital assessment. Did not respond to name call and not present in ED lobby.

## 2021-02-05 NOTE — ED Notes (Signed)
Pt called x2 for VS assessment. Currently not present or visible in lobby. Did not respond to name call.

## 2021-02-11 ENCOUNTER — Encounter: Payer: Self-pay | Admitting: Physician Assistant

## 2021-02-11 ENCOUNTER — Other Ambulatory Visit: Payer: Self-pay

## 2021-02-11 ENCOUNTER — Ambulatory Visit: Payer: Self-pay | Attending: Physician Assistant | Admitting: Physician Assistant

## 2021-02-11 VITALS — BP 153/81 | HR 83 | Temp 98.3°F | Resp 18 | Ht 59.0 in | Wt 193.0 lb

## 2021-02-11 DIAGNOSIS — R569 Unspecified convulsions: Secondary | ICD-10-CM

## 2021-02-11 DIAGNOSIS — R7303 Prediabetes: Secondary | ICD-10-CM

## 2021-02-11 DIAGNOSIS — E669 Obesity, unspecified: Secondary | ICD-10-CM

## 2021-02-11 DIAGNOSIS — I10 Essential (primary) hypertension: Secondary | ICD-10-CM

## 2021-02-11 DIAGNOSIS — H47211 Primary optic atrophy, right eye: Secondary | ICD-10-CM

## 2021-02-11 DIAGNOSIS — E782 Mixed hyperlipidemia: Secondary | ICD-10-CM

## 2021-02-11 NOTE — Progress Notes (Signed)
Patient has not eaten or taken medication today. Patient has last taken BP medication Monday morning. Patient denies pain today. Patient does not recall the event, only what was shared with her once she "came to". Patient has a BP cuff at home that she has not used regularly.

## 2021-02-11 NOTE — Patient Instructions (Signed)
We will call you with today's lab results.  I do encourage you to check your blood pressure at home on a daily basis, keep a written log and have available for all office visits.  I have started a referral for you to be seen by ophthalmology and neurology for further evaluation.  Kennieth Rad, PA-C Physician Assistant [facility] [email or web address]   Non-Epileptic Seizures, Adult A non-epileptic seizure is an event that can cause abnormal movements or a loss of consciousness. These events differ from epileptic seizures because they are not caused by abnormal electrical and chemical activity in the brain. There are two types of non-epileptic seizures: Physiologic. This type results from an underlying problem with body function. Psychogenic. This type results from an underlying mental health disorder. What are the causes? The cause of this condition depends on the kind of non-epileptic seizure that you have. Causes of physiologic non-epileptic seizures Sudden drop in blood pressure or heart rhythm problems. Low blood sugar (glucose) or low levels of salt (sodium) in your blood. Migraine. Sleep disorders or movement disorders. Certain medicines. Heavy use of drugs or alcohol. Head injuries. Causes of psychogenic non-epileptic seizures Stress. Emotional trauma. Sexual or physical abuse. Big life events, such as divorce or death of a loved one. Mental health disorders, including anxiety and depression. What are the signs or symptoms? Symptoms of a non-epileptic seizure can be similar to those of an epileptic seizure. They may include: A change in attention or behavior. A loss of consciousness or fainting. Uncontrollable shaking (convulsions) with fast, jerking movements. Drooling, tongue biting, or grunting. Rapid eye movements. Being unable to control when you urinate or have bowel movements. After a non-epileptic seizure,  you may: Have a headache or sore muscles. Feel confused or sleepy. Non-epileptic seizures usually: Do not cause physical injuries. Start slowly. Include crying or shrieking. Last longer than 2 minutes. Include pelvic thrusting. How is this diagnosed? Non-epileptic seizures may be diagnosed by medical history, physical exam, and symptoms. Your health care provider may: Talk with your friends or relatives who have seen you have a seizure. Ask you to write down your seizure activity and the things that led up to the seizure, and ask you to share that information with him or her. You may also have tests to look for causes of physiologic non-epileptic seizures. Tests may include: An electroencephalogram (EEG) to check the electrical activity in your brain. Video EEG in the hospital. This takes 2-7 days. Blood tests. An electrocardiogram (ECG) to check for an abnormal heart rhythm. A CT scan. If your health care provider thinks you have had a psychogenic non-epileptic seizure, you may need to be evaluated by a mental health specialist. How is this treated? The treatment for your non-epileptic seizures will depend on their cause. When the underlying condition is treated, your non-epileptic seizures should stop. Medicines to treat seizures do not help with non-epileptic events. If your non-epileptic seizures are being caused by emotional trauma or stress, your health care provider may recommend that you see a mental health specialist. Treatment may include: Relaxation therapy or cognitive behavioral therapy (CBT). Medicines for depression or anxiety. Individual or family counseling. Some people have both psychogenic non-epileptic seizures and epileptic seizures. If you have both of these types, you may be prescribed medicine to manage the epileptic seizures. Follow these instructions at home: Home care will depend on the type of non-epileptic seizures that you have. Follow this general  guidance: Avoid alcohol and drug use Avoid  using any substance that may prevent your medicine from working properly. If you are prescribed medicine for seizures: Do not use drugs. Limit or avoid drinking alcohol. If you drink alcohol: Limit how much you have to: 0-1 drink a day for women who are not pregnant. 0-2 drinks a day for men. Know how much alcohol is in your drink. In the [place] one drink equals one 12 oz bottle of beer (355 mL), one 5 oz glass of wine (148 mL), or one 1 oz glass of hard liquor (44 mL). Involve family, friends, and coworkers Make sure family members, friends, and coworkers are trained in how to help you if you have a seizure. If you have a seizure, they should: Lay you on the ground to prevent a fall. Place a pillow or piece of clothing under your head. Loosen any clothing around your neck. Turn you onto your side. If you vomit, this position will help keep your windpipe clear. General instructions Follow all instructions from your health care provider. These may include ways to prevent seizures and what to do if you have a seizure. Take over-the-counter and prescription medicines only as told by your health care provider. Keep all follow-up visits. This is important. Contact a health care provider if: Your non-epileptic seizures change or happen more often. Your non-epileptic seizures do not stop after treatment. Get help right away if: You injure yourself during a non-epileptic seizure. You have one non-epileptic seizure after another. You have trouble recovering from a non-epileptic seizure. You have chest pain or trouble breathing. You have a non-epileptic seizure that lasts longer than 5 minutes. These symptoms may represent a serious problem that is an emergency. Do not wait to see if the symptoms will go away. Get medical help right away. Call your local emergency services (911 in the [place] Do not drive yourself to the hospital. If you ever feel like you  may hurt yourself or others, or have thoughts about taking your own life, get help right away. Go to your nearest emergency department or: Call your local emergency services (911 in the [place] Call a suicide crisis helpline, such as the [place] at 402-103-6172. This is open 24 hours a day in the [place] Text the Crisis Text Line at (940)213-9252 (in the [place].). Summary Non-epileptic seizures are events that can cause abnormal movements or a loss of consciousness. These events are caused by an underlying problem with body function or a mental health disorder. The treatment for your non-epileptic seizures will depend on their cause. When the underlying condition is treated, your non-epileptic seizures should stop. Medicines for seizures do not treat non-epileptic events. People with non-epileptic seizures may also have epileptic seizures and may need medicines to treat seizures. Make sure family members, friends, and coworkers are trained in how to help you if you have a non-epileptic seizure. This includes laying you on the ground to prevent a fall, protecting your head and neck, and turning you onto your side. This information is not intended to replace advice given to you by your health care provider. Make sure you discuss any questions you have with your health care provider. Document Revised: 10/06/2019 Document Reviewed: 10/06/2019 Elsevier Patient Education  [place].

## 2021-02-11 NOTE — Progress Notes (Signed)
Established Patient Office Visit  Subjective:  Patient ID: Marie Ferguson, female    DOB: 10-Oct-1965  Age: 55 y.o. MRN: [id number]  CC:  Chief Complaint  Patient presents with   Follow-up   Seizures    HPI Marie Ferguson reports that she had a "seizure-like episode" on February 01, 2021.  Reports that she was walking into a nursing home, states that she does not remember the incident but was told that she had a blank look on her face and tears started rolling down her face.  Reports that when she next knew what was happening an ambulance was present.  Reports that she refused to go to the emergency department for further evaluation because she had to go to work.  Denies loss of bladder function, falling, LOC. Patient states that she did go to the emergency room on October 5 for evaluation, states that she waited approximately 5 hours without being seen and decided to leave.  Reports she remembers one incident approximately 3 to 4 years ago where she was at work and "did not feel like herself", states that she did have a few moments that she did not remember, and when she "came to" she was sitting in a chair drinking orange juice.  Reports that she has not been checking her blood pressure at home.  Reports that her stress level has been elevated since this past incident, but prior to that she felt her stress level was "normal".  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  Reports that she was told by ophthalmology that there was not anything that can be done about that.  Reports this was "many years ago" Reports her last eye exam was in 2018, states that she was prescribed eyeglasses but feels that she can see better without them and has not been wearing them.  Past Medical History:  Diagnosis Date   Hypertension    Sudden loss of vision, right    burst  blood vessel    Past Surgical History:  Procedure Laterality Date   ABDOMINAL HYSTERECTOMY     BREAST LUMPECTOMY WITH RADIOACTIVE SEED LOCALIZATION Left 03/03/2019   Procedure: LEFT BREAST LUMPECTOMY X 2 WITH 2 RADIOACTIVE SEED LOCALIZATION;  Surgeon: Jovita Kussmaul, MD;  Location: [facility];  Service: General;  Laterality: Left;   CESAREAN SECTION     x 3   COLONOSCOPY  07/21/2019   POLYPECTOMY  07/21/2019    Family History  Problem Relation Age of Onset   Hypertension Mother    Hypertension Father    Heart attack Brother    Diabetes Maternal Aunt        x2   Diabetes Maternal Uncle     Social History   Socioeconomic History   Marital status: Single    Spouse name: Not on file   Number of children: 3   Years of education: 1 yr community college   Highest education level: Some college, no degree  Occupational History   Occupation: IT sales professional  Tobacco Use   Smoking status: Every Day    Packs/day: 0.25    Types: Cigarettes   Smokeless tobacco: Never   Tobacco comments:    5 cigs/ day  Vaping Use   Vaping Use: Never used  Substance and Sexual Activity   Alcohol use: Yes  Comment: rarely   Drug use: Yes    Types: Marijuana    Comment: 1x a week   Sexual activity: Not Currently  Other Topics Concern   Not on file  Social History Narrative   Not on file   Social Determinants of Health   Financial Resource Strain: Not on file  Food Insecurity: Not on file  Transportation Needs: Not on file  Physical Activity: Not on file  Stress: Not on file  Social Connections: Not on file  Intimate Partner Violence: Not on file    Outpatient Medications Prior to Visit  Medication Sig Dispense Refill   acetaminophen (TYLENOL) 325 MG tablet Take 650 mg by mouth every 6 (six) hours as needed.     amLODipine (NORVASC) 10 MG tablet Take 1 tablet (10 mg total) by mouth daily. 90 tablet 1   atorvastatin (LIPITOR) 10 MG tablet TAKE 1 TABLET (10 MG TOTAL) BY  MOUTH DAILY. 90 tablet 1   hydrochlorothiazide (HYDRODIURIL) 25 MG tablet Take 1 tablet (25 mg total) by mouth daily. 90 tablet 1   losartan (COZAAR) 25 MG tablet Take 1 tablet (25 mg total) by mouth daily. 30 tablet 4   No facility-administered medications prior to visit.    No Known Allergies  ROS Review of Systems  Constitutional: Negative.   HENT: Negative.    Eyes:  Positive for visual disturbance. Negative for pain and redness.  Respiratory:  Negative for shortness of breath.   Cardiovascular:  Negative for chest pain.  Gastrointestinal: Negative.   Endocrine: Negative.   Genitourinary: Negative.   Musculoskeletal: Negative.   Skin: Negative.   Allergic/Immunologic: Negative.   Neurological:  Negative for dizziness, syncope, facial asymmetry, speech difficulty, weakness, light-headedness and headaches.  Hematological: Negative.   Psychiatric/Behavioral: Negative.       Objective:    Physical Exam Vitals and nursing note reviewed.  Constitutional:      Appearance: Normal appearance. She is obese.  HENT:     Head: Normocephalic and atraumatic.     Right Ear: External ear normal.     Left Ear: External ear normal.     Nose: Nose normal.     Mouth/Throat:     Mouth: Mucous membranes are moist.     Pharynx: Oropharynx is clear.  Eyes:     Extraocular Movements: Extraocular movements intact.     Conjunctiva/sclera: Conjunctivae normal.  Cardiovascular:     Rate and Rhythm: Normal rate and regular rhythm.     Pulses: Normal pulses.     Heart sounds: Normal heart sounds.  Pulmonary:     Effort: Pulmonary effort is normal.     Breath sounds: Normal breath sounds.  Musculoskeletal:        General: Normal range of motion.     Cervical back: Normal range of motion and neck supple.  Skin:    General: Skin is warm and dry.  Neurological:     General: No focal deficit present.     Mental Status: She is alert and oriented to person, place, and time.     Cranial  Nerves: No cranial nerve deficit.     Sensory: No sensory deficit.     Motor: No weakness.     Coordination: Coordination normal.     Gait: Gait normal.  Psychiatric:        Mood and Affect: Mood normal.        Behavior: Behavior normal.        Thought Content: Thought content  normal.        Judgment: Judgment normal.    BP (!) 153/81 (BP Location: Left Arm, Patient Position: Sitting, Cuff Size: Normal)   Pulse 83   Temp 98.3 F (36.8 C) (Oral)   Resp 18   Ht 4' 11"  (1.499 m)   Wt 193 lb (87.5 kg)   SpO2 97%   BMI 38.98 kg/m  Wt Readings from Last 3 Encounters:  02/11/21 193 lb (87.5 kg)  02/05/21 191 lb (86.6 kg)  12/24/20 189 lb 3.2 oz (85.8 kg)     Health Maintenance Due  Topic Date Due   Hepatitis C Screening  Never done   MAMMOGRAM  Never done   INFLUENZA VACCINE  12/02/2020    There are no preventive care reminders to display for this patient.  Lab Results  Component Value Date   TSH 1.470 02/11/2021   Lab Results  Component Value Date   WBC 6.1 02/11/2021   HGB 13.0 02/11/2021   HCT 38.1 02/11/2021   MCV 86 02/11/2021   PLT 382 02/11/2021   Lab Results  Component Value Date   NA 139 02/11/2021   K 4.5 02/11/2021   CO2 23 12/24/2020   GLUCOSE 81 02/11/2021   BUN 10 02/11/2021   CREATININE 0.65 02/11/2021   BILITOT 0.3 02/11/2021   ALKPHOS 119 02/11/2021   AST 13 02/11/2021   ALT 19 12/24/2020   PROT 7.6 02/11/2021   ALBUMIN 4.4 02/11/2021   CALCIUM 9.9 02/11/2021   ANIONGAP 11 02/28/2019   EGFR 103 02/11/2021   Lab Results  Component Value Date   CHOL 255 (H) 12/24/2020   Lab Results  Component Value Date   HDL 50 12/24/2020   Lab Results  Component Value Date   LDLCALC 187 (H) 12/24/2020   Lab Results  Component Value Date   TRIG 101 12/24/2020   Lab Results  Component Value Date   CHOLHDL 5.1 (H) 12/24/2020   Lab Results  Component Value Date   HGBA1C 5.6 12/24/2020      Assessment & Plan:   Problem List Items  Addressed This Visit       Cardiovascular and Mediastinum   Essential hypertension     Nervous and Auditory   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 Metabolic Panel (12) (Completed)   TSH (Completed)       No orders of the defined types were placed in this encounter. 1. Seizure-like activity [facility]) Patient education given on red flag warnings for prompt reevaluation.  Refer to neurology for further evaluation - Ambulatory referral to Neurology - CBC with Differential/Platelet - Comp. Metabolic Panel (12) - TSH  2. Essential hypertension Patient encouraged to check blood pressure at home on a daily basis, keep a written log and have available for all office visits.  3. Primary optic atrophy of right eye Refer to ophthalmology for further evaluation - Ambulatory referral to Ophthalmology  4. Hyperlipidemia, mixed   5. Obesity (BMI 30-39.9)   6. Prediabetes A1C on 12/24/20 was 5.6   I have reviewed the patient's medical history (PMH, PSH, Social History, Family History, Medications, and  allergies) , and have been updated if relevant. I spent 32 minutes reviewing chart and  face to face time with patient.     Follow-up: Return if symptoms worsen or fail to improve.    Loraine Grip Mayers, PA-C

## 2021-02-12 ENCOUNTER — Encounter: Payer: Self-pay | Admitting: Neurology

## 2021-02-12 DIAGNOSIS — R569 Unspecified convulsions: Secondary | ICD-10-CM | POA: Insufficient documentation

## 2021-02-12 LAB — CBC WITH DIFFERENTIAL/PLATELET
Basophils Absolute: 0 10*3/uL (ref 0.0–0.2)
Basos: 1 %
EOS (ABSOLUTE): 0.1 10*3/uL (ref 0.0–0.4)
Eos: 2 %
Hematocrit: 38.1 % (ref 34.0–46.6)
Hemoglobin: 13 g/dL (ref 11.1–15.9)
Immature Grans (Abs): 0 10*3/uL (ref 0.0–0.1)
Immature Granulocytes: 0 %
Lymphocytes Absolute: 1.8 10*3/uL (ref 0.7–3.1)
Lymphs: 29 %
MCH: 29.5 pg (ref 26.6–33.0)
MCHC: 34.1 g/dL (ref 31.5–35.7)
MCV: 86 fL (ref 79–97)
Monocytes Absolute: 0.6 10*3/uL (ref 0.1–0.9)
Monocytes: 11 %
Neutrophils Absolute: 3.5 10*3/uL (ref 1.4–7.0)
Neutrophils: 57 %
Platelets: 382 10*3/uL (ref 150–450)
RBC: 4.41 x10E6/uL (ref 3.77–5.28)
RDW: 12.8 % (ref 11.7–15.4)
WBC: 6.1 10*3/uL (ref 3.4–10.8)

## 2021-02-12 LAB — COMP. METABOLIC PANEL (12)
AST: 13 IU/L (ref 0–40)
Albumin/Globulin Ratio: 1.4 (ref 1.2–2.2)
Albumin: 4.4 g/dL (ref 3.8–4.9)
Alkaline Phosphatase: 119 IU/L (ref 44–121)
BUN/Creatinine Ratio: 15 (ref 9–23)
BUN: 10 mg/dL (ref 6–24)
Bilirubin Total: 0.3 mg/dL (ref 0.0–1.2)
Calcium: 9.9 mg/dL (ref 8.7–10.2)
Chloride: 100 mmol/L (ref 96–106)
Creatinine, Ser: 0.65 mg/dL (ref 0.57–1.00)
Globulin, Total: 3.2 g/dL (ref 1.5–4.5)
Glucose: 81 mg/dL (ref 70–99)
Potassium: 4.5 mmol/L (ref 3.5–5.2)
Sodium: 139 mmol/L (ref 134–144)
Total Protein: 7.6 g/dL (ref 6.0–8.5)
eGFR: 103 mL/min/{1.73_m2} (ref >59)

## 2021-02-12 LAB — TSH: TSH: 1.47 u[IU]/mL (ref 0.450–4.500)

## 2021-02-13 ENCOUNTER — Telehealth: Payer: Self-pay | Admitting: *Deleted

## 2021-02-13 NOTE — Telephone Encounter (Signed)
-----   Message from Kennieth Rad, [place] sent at 02/12/2021  8:36 AM EDT ----- Please call patient and let her know that her thyroid function, kidney and liver function are within normal limits.  She does not show signs of anemia.  These labs do not speak to why she may have had her seizure-like episode.  Continue follow-up with neurology for further evaluation.

## 2021-02-13 NOTE — Telephone Encounter (Signed)
Patient is aware of labs being normal and not showing any relation to Sz. Patient does have neurology appt 10/19.

## 2021-02-19 ENCOUNTER — Other Ambulatory Visit: Payer: Self-pay

## 2021-02-19 ENCOUNTER — Ambulatory Visit (INDEPENDENT_AMBULATORY_CARE_PROVIDER_SITE_OTHER): Payer: Self-pay | Admitting: Neurology

## 2021-02-19 ENCOUNTER — Encounter: Payer: Self-pay | Admitting: Neurology

## 2021-02-19 VITALS — BP 162/98 | HR 94 | Ht 59.0 in | Wt 194.2 lb

## 2021-02-19 DIAGNOSIS — R404 Transient alteration of awareness: Secondary | ICD-10-CM

## 2021-02-19 NOTE — Patient Instructions (Signed)
Good to meet you! I would like to do some tests, please apply for [facility] and let me know when you hear back so we can proceed with testing. 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 Be safe!
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
## 2021-02-25 ENCOUNTER — Ambulatory Visit: Payer: No Typology Code available for payment source

## 2021-03-25 ENCOUNTER — Ambulatory Visit: Payer: No Typology Code available for payment source | Admitting: Internal Medicine

## 2021-06-02 ENCOUNTER — Encounter: Payer: Self-pay | Admitting: Internal Medicine

## 2021-07-23 ENCOUNTER — Ambulatory Visit: Payer: Self-pay

## 2021-07-23 NOTE — Telephone Encounter (Signed)
Chief Complaint: Need appointment to get started on disability forms ?Symptoms: None ?Frequency: N/A ?Pertinent Negatives: Patient denies any symptoms ?Disposition: '[]'$ ED /'[]'$ Urgent Care (no appt availability in office) / '[]'$ Appointment(In office/virtual)/ '[x]'$  [facility]/ '[]'$ Home Care/ '[]'$ Refused Recommended Disposition /'[]'$ [facility]/ '[]'$  Follow-up with PCP ?Additional Notes: Patient has been unemployed since 04/29/21 and says no income, so she will need an appointment to get started on disability so she can get some income coming in. She asked to see any provider as soon as possible. Denies any symptoms, not crying on the phone. Scheduled with Freeman Caldron, PA-C on Wednesday 07/30/21 at 1110. ? ? ?Summary: pt needs appt, dr needs sign off in order for to get financial help/ very upset  ? Pt needs a fu call, , is a pt of Dr Wynetta Emery, she has very poor eyesight and due to this has lost her job with the [employer]. She states if she has a check up she will be able to get some type of help from Medicare but the dr has to sign off on form. She has no money and has had to move into some very dangerous projects and is afraid. Could you see if someone could work her in asap, she was crying on the phone as the appt are so advanced  ?  ? ?Reason for Disposition ? Nursing judgment ? ?Protocols used: No Guideline or Reference Available-A-AH ? ?

## 2021-07-29 ENCOUNTER — Ambulatory Visit: Payer: No Typology Code available for payment source | Admitting: Critical Care Medicine

## 2021-07-30 ENCOUNTER — Ambulatory Visit: Payer: No Typology Code available for payment source | Admitting: Physician Assistant

## 2021-07-31 ENCOUNTER — Emergency Department (HOSPITAL_COMMUNITY)
Admission: EM | Admit: 2021-07-31 | Discharge: 2021-07-31 | Disposition: A | Discharge status: Home / Self Care | Payer: Self-pay | Attending: Emergency Medicine | Admitting: Emergency Medicine

## 2021-07-31 ENCOUNTER — Ambulatory Visit: Payer: Self-pay | Admitting: *Deleted

## 2021-07-31 ENCOUNTER — Emergency Department (HOSPITAL_COMMUNITY): Payer: Self-pay

## 2021-07-31 ENCOUNTER — Other Ambulatory Visit: Payer: Self-pay

## 2021-07-31 ENCOUNTER — Encounter (HOSPITAL_COMMUNITY): Payer: Self-pay

## 2021-07-31 ENCOUNTER — Other Ambulatory Visit: Payer: Self-pay | Admitting: *Deleted

## 2021-07-31 DIAGNOSIS — I10 Essential (primary) hypertension: Secondary | ICD-10-CM

## 2021-07-31 DIAGNOSIS — Z79899 Other long term (current) drug therapy: Secondary | ICD-10-CM | POA: Insufficient documentation

## 2021-07-31 DIAGNOSIS — R42 Dizziness and giddiness: Secondary | ICD-10-CM | POA: Insufficient documentation

## 2021-07-31 DIAGNOSIS — R519 Headache, unspecified: Secondary | ICD-10-CM | POA: Insufficient documentation

## 2021-07-31 LAB — URINALYSIS, ROUTINE W REFLEX MICROSCOPIC
Bilirubin Urine: NEGATIVE
Glucose, UA: NEGATIVE mg/dL
Hgb urine dipstick: NEGATIVE
Ketones, ur: NEGATIVE mg/dL
Nitrite: NEGATIVE
Protein, ur: NEGATIVE mg/dL
Specific Gravity, Urine: 1.017 (ref 1.005–1.030)
pH: 6 (ref 5.0–8.0)

## 2021-07-31 LAB — BASIC METABOLIC PANEL
Anion gap: 8 (ref 5–15)
BUN: 13 mg/dL (ref 6–20)
CO2: 24 mmol/L (ref 22–32)
Calcium: 8.8 mg/dL — ABNORMAL LOW (ref 8.9–10.3)
Chloride: 105 mmol/L (ref 98–111)
Creatinine, Ser: 0.74 mg/dL (ref 0.44–1.00)
GFR, Estimated: >60 mL/min (ref >60)
Glucose, Bld: 106 mg/dL — ABNORMAL HIGH (ref 70–99)
Potassium: 3.9 mmol/L (ref 3.5–5.1)
Sodium: 137 mmol/L (ref 135–145)

## 2021-07-31 LAB — CBC
HCT: 41.4 % (ref 36.0–46.0)
Hemoglobin: 13.7 g/dL (ref 12.0–15.0)
MCH: 28.9 pg (ref 26.0–34.0)
MCHC: 33.1 g/dL (ref 30.0–36.0)
MCV: 87.3 fL (ref 80.0–100.0)
Platelets: 369 10*3/uL (ref 150–400)
RBC: 4.74 MIL/uL (ref 3.87–5.11)
RDW: 12.8 % (ref 11.5–15.5)
WBC: 5.3 10*3/uL (ref 4.0–10.5)
nRBC: 0 % (ref 0.0–0.2)

## 2021-07-31 LAB — I-STAT BETA HCG BLOOD, ED (MC, WL, AP ONLY): I-stat hCG, quantitative: <5 m[IU]/mL (ref <5)

## 2021-07-31 LAB — TROPONIN I (HIGH SENSITIVITY)
Troponin I (High Sensitivity): 14 ng/L (ref <18)
Troponin I (High Sensitivity): 14 ng/L (ref <18)

## 2021-07-31 MED ORDER — LABETALOL HCL 5 MG/ML IV SOLN
20.0000 mg | Freq: Once | INTRAVENOUS | Status: AC
  Administered 2021-07-31: 20 mg via INTRAVENOUS
  Filled 2021-07-31: qty 4

## 2021-07-31 MED ORDER — MECLIZINE HCL 25 MG PO TABS
25.0000 mg | ORAL_TABLET | Freq: Once | ORAL | Status: AC
  Administered 2021-07-31: 25 mg via ORAL
  Filled 2021-07-31: qty 1

## 2021-07-31 MED ORDER — LABETALOL HCL 5 MG/ML IV SOLN
10.0000 mg | Freq: Once | INTRAVENOUS | Status: AC
  Administered 2021-07-31: 10 mg via INTRAVENOUS
  Filled 2021-07-31: qty 4

## 2021-07-31 MED ORDER — MECLIZINE HCL 25 MG PO TABS
25.0000 mg | ORAL_TABLET | Freq: Three times a day (TID) | ORAL | 0 refills | Status: DC | PRN
  Filled 2021-07-31: qty 30, 10d supply, fill #0

## 2021-07-31 NOTE — ED Triage Notes (Signed)
Patient to ED with c/o lightheadedness. Patient states she was at daughter's house yesterday and had an episode where her daughter told her she became unresponsive and stared into space for about 10 minutes. Pt states she had a similar episode in November and was unable to see a neurologist due to insurance issues. Pt denies loss of consciousness, vision changes, pain.  ?

## 2021-07-31 NOTE — ED Provider Triage Note (Signed)
Emergency Medicine Provider Triage Evaluation Note ? ?Joyia Riehle , a 56 y.o. female  was evaluated in triage.  Pt complains of "dizzy episode" yesterday. Pt reports she was with her daughter when she "stared off into space and wasn't responding". Pt states her daughter told her it lasted about 10 minutes. No LOC. No hx of seizures.  ? ?Review of Systems  ?Positive: "Dizzy episode" ?Negative: Headache, blurry vision, syncope, CP ? ?Physical Exam  ?Ht '4\' 11"'$  (1.499 m)   Wt 90.3 kg   BMI 40.19 kg/m?  ?Gen:   Awake, no distress   ?Resp:  Normal effort  ?MSK:   Moves extremities without difficulty  ?Other:  No facial asymmetry, no slurred speech ? ?Medical Decision Making  ?Medically screening exam initiated at 2:50 PM.  Appropriate orders placed.  Kenzington Mielke was informed that the remainder of the evaluation will be completed by another provider, this initial triage assessment does not replace that evaluation, and the importance of remaining in the ED until their evaluation is complete. ? ? ?  ?Kateri Plummer, PA-C ?07/31/21 1451 ? ?

## 2021-07-31 NOTE — Discharge Instructions (Signed)
Seen in the emergency department today for dizziness.  Your work-up has been reassuring.  I have placed an order for an MRI to be done outpatient.  They should call you to schedule this.  Additionally I have placed an order for an EEG for your brain that your neurologist requested.  They should call you to schedule this as well.  Please ensure that you make a follow-up appoint with your primary care provider on 08/11/2021.  They need to adjust your blood pressure medications as you have high blood pressure here in the emergency department.  Please return to the emergency department for any worsening symptoms.  You should also follow-up with your neurologist Dr. Delice Lesch for a follow-up appointment. ?

## 2021-07-31 NOTE — ED Provider Notes (Signed)
?[facility] ?Provider Note ? ? ?CSN: [id number] ?Arrival date & time: 07/31/21  1439 ? ?  ? ?History ? ?Chief Complaint  ?Patient presents with  ? Dizziness  ? ? ?Marie Ferguson is a 56 y.o. female.  With past medical history of hypertension, blindness in the right eye who presents to the emergency department with dizziness. ? ?Patient states that yesterday evening she was at her daughter's house when she had an episode of "staring off into space" for about 10 minutes.  She states that her daughter states she was not responsive or moving for this period of time and then came back to consciousness.  Per the patient she never lost consciousness or syncopized, she was just staring.  She does not quite remember this episode.  Additionally since yesterday she has had dizziness and lightheadedness.  She denies falling or hitting her head.  She denies vomiting, changes to her vision, chest pain, shortness of breath or fevers.  She states more remotely last year she had a similar episode where she was staring off into space outside of her apartment complex.  Bystanders called EMS but she refused to come to the ED at that time. ? ?She states she has not been seen by neurologist, however there is a note from 02/19/2021 from Dr. Delice Lesch with neurology where she was seen for, "evaluation of staring spells." ? ?Dizziness ?Associated symptoms: headaches   ?Associated symptoms: no chest pain, no shortness of breath and no weakness   ? ?  ? ?Home Medications ?Prior to Admission medications   ?Medication Sig Start Date End Date Taking? Authorizing Provider  ?acetaminophen (TYLENOL) 325 MG tablet Take 650 mg by mouth every 6 (six) hours as needed.    [provider]  ?amLODipine (NORVASC) 10 MG tablet Take 1 tablet (10 mg total) by mouth daily. 12/24/20   Ladell Pier, MD  ?atorvastatin (LIPITOR) 10 MG tablet TAKE 1 TABLET (10 MG TOTAL) BY MOUTH DAILY. 12/24/20   Ladell Pier, MD   ?hydrochlorothiazide (HYDRODIURIL) 25 MG tablet Take 1 tablet (25 mg total) by mouth daily. 12/24/20   Ladell Pier, MD  ?losartan (COZAAR) 25 MG tablet Take 1 tablet (25 mg total) by mouth daily. 12/24/20   Ladell Pier, MD  ?   ? ?Allergies    ?Patient has no known allergies.   ? ?Review of Systems   ?Review of Systems  ?Constitutional:  Negative for fatigue and fever.  ?Eyes:  Negative for visual disturbance.  ?Respiratory:  Negative for shortness of breath.   ?Cardiovascular:  Negative for chest pain.  ?Gastrointestinal:  Negative for abdominal pain.  ?Genitourinary:  Negative for hematuria.  ?Neurological:  Positive for dizziness, light-headedness and headaches. Negative for seizures, syncope, facial asymmetry, weakness and numbness.  ?Psychiatric/Behavioral:  Positive for decreased concentration.   ?All other systems reviewed and are negative. ? ?Physical Exam ?Updated Vital Signs ?BP (!) 240/137   Pulse 91   Temp 98.7 ?F (37.1 ?C) (Oral)   Resp 16   Ht '4\' 11"'$  (1.499 m)   Wt 90.3 kg   SpO2 94%   BMI 40.19 kg/m?  ?Physical Exam ?Vitals and nursing note reviewed.  ?Constitutional:   ?   General: She is not in acute distress. ?   Appearance: Normal appearance. She is obese. She is not ill-appearing or toxic-appearing.  ?HENT:  ?   Head: Normocephalic and atraumatic.  ?   Nose: Nose normal.  ?  Mouth/Throat:  ?   Mouth: Mucous membranes are moist.  ?   Pharynx: Oropharynx is clear. No posterior oropharyngeal erythema.  ?Eyes:  ?   General: No scleral icterus.    ?   Right eye: No discharge.     ?   Left eye: No discharge.  ?   Extraocular Movements: Extraocular movements intact.  ?   Conjunctiva/sclera: Conjunctivae normal.  ?   Pupils: Pupils are equal, round, and reactive to light.  ?Cardiovascular:  ?   Rate and Rhythm: Normal rate and regular rhythm.  ?   Pulses: Normal pulses.  ?   Heart sounds: Normal heart sounds. No murmur heard. ?Pulmonary:  ?   Effort: Pulmonary effort is normal. No  respiratory distress.  ?   Breath sounds: Normal breath sounds.  ?Abdominal:  ?   General: Bowel sounds are normal. There is no distension.  ?   Palpations: Abdomen is soft.  ?   Tenderness: There is no abdominal tenderness.  ?Musculoskeletal:     ?   General: Normal range of motion.  ?   Cervical back: Normal range of motion and neck supple. No rigidity or tenderness.  ?   Right lower leg: No edema.  ?   Left lower leg: No edema.  ?Skin: ?   General: Skin is warm and dry.  ?   Capillary Refill: Capillary refill takes less than 2 seconds.  ?   Findings: No rash.  ?Neurological:  ?   General: No focal deficit present.  ?   Mental Status: She is alert and oriented to person, place, and time.  ?   Cranial Nerves: Cranial nerves 2-12 are intact. No cranial nerve deficit, dysarthria or facial asymmetry.  ?   Sensory: Sensory deficit present.  ?   Motor: Motor function is intact. No weakness or pronator drift.  ?   Coordination: Finger-Nose-Finger Test normal.  ?   Comments: Sensation deficit to the right face   ?Psychiatric:     ?   Mood and Affect: Mood normal.     ?   Behavior: Behavior normal.     ?   Thought Content: Thought content normal.     ?   Judgment: Judgment normal.  ? ? ?ED Results / Procedures / Treatments   ?Labs ?(all labs ordered are listed, but only abnormal results are displayed) ?Labs Reviewed  ?BASIC METABOLIC PANEL - Abnormal; Notable for the following components:  ?    Result Value  ? Glucose, Bld 106 (*)   ? Calcium 8.8 (*)   ? All other components within normal limits  ?URINALYSIS, ROUTINE W REFLEX MICROSCOPIC - Abnormal; Notable for the following components:  ? APPearance HAZY (*)   ? Leukocytes,Ua LARGE (*)   ? Bacteria, UA RARE (*)   ? Trichomonas, UA PRESENT (*)   ? All other components within normal limits  ?CBC  ?CBG MONITORING, ED  ?I-STAT BETA HCG BLOOD, ED ([facility], [facility], [facility] ONLY)  ?TROPONIN I (HIGH SENSITIVITY)  ?TROPONIN I (HIGH SENSITIVITY)  ? ?EKG ?EKG  Interpretation ? ?Date/Time:  Thursday July 31 2021 14:34:42 EDT ?Ventricular Rate:  94 ?PR Interval:  154 ?QRS Duration: 72 ?QT Interval:  362 ?QTC Calculation: 452 ?R Axis:   18 ?Text Interpretation: Normal sinus rhythm Possible Left atrial enlargement Abnormal ECG When compared with ECG of 29-Oct-2018 12:08, PREVIOUS ECG IS PRESENT no stemi Confirmed by Wynona Dove ([id number]) on 07/31/2021 7:36:55 PM ? ?Radiology ?CT Head Wo Contrast ? ?  Result Date: 07/31/2021 ?CLINICAL DATA:  Dizziness, persistent/recurrent, cardiac or vascular cause suspected EXAM: CT HEAD WITHOUT CONTRAST TECHNIQUE: Contiguous axial images were obtained from the base of the skull through the vertex without intravenous contrast. RADIATION DOSE REDUCTION: This exam was performed according to the departmental dose-optimization program which includes automated exposure control, adjustment of the mA and/or kV according to patient size and/or use of iterative reconstruction technique. COMPARISON:  CT head 10/29/2018 BRAIN: BRAIN Patchy and confluent areas of decreased attenuation are noted throughout the deep and periventricular white matter of the cerebral hemispheres bilaterally, compatible with chronic microvascular ischemic disease. No evidence of large-territorial acute infarction. No parenchymal hemorrhage. No mass lesion. No extra-axial collection. No mass effect or midline shift. No hydrocephalus. Basilar cisterns are patent. Empty sella. Vascular: No hyperdense vessel. Atherosclerotic calcifications are present within the cavernous internal carotid arteries. Skull: No acute fracture or focal lesion. Sinuses/Orbits: Paranasal sinuses and mastoid air cells are clear. The orbits are unremarkable. Other: None. IMPRESSION: 1. No acute intracranial abnormality. 2. Empty sella. Findings is often a normal anatomic variant but can be associated with idiopathic intracranial hypertension (pseudotumor cerebri). Electronically Signed   By: Iven Finn  M.D.   On: 07/31/2021 17:56   ? ?Procedures ?Procedures  ? ?Medications Ordered in ED ?Medications  ?labetalol (NORMODYNE) injection 10 mg (10 mg Intravenous Given 07/31/21 1734)  ?labetalol (NORMODYNE) injection 20 mg (20 mg Intravenous Given 3/30/2

## 2021-07-31 NOTE — ED Notes (Signed)
PA aware of pt BP  ?

## 2021-07-31 NOTE — ED Notes (Signed)
PA aware of pt BP starting to become more elevated - pt switched to larger cuff at this time  ?

## 2021-07-31 NOTE — Telephone Encounter (Signed)
?  Chief Complaint: episodes of confusion, starring out and memory lapses ?Symptoms: alert and oriented x 3 now. Noted 2 episodes of starring out nonverbal unresponsive but not passing out. Reports elevated BP but not able to check at time of call. Reports needing refills on HCTZ and amlodipine.  ?Frequency: 1st episode Oct.and again yesterday  ?Pertinent Negatives: Patient denies chest pain difficulty breathing, no weakness on either side of body , no headache, denies dizziness now, no visual issues hx blind in right eye, no issues with balance.  ?Disposition: '[x]'$ ED /'[]'$ Urgent Care (no appt availability in office) / '[]'$ Appointment(In office/virtual)/ '[]'$  [facility]/ '[]'$ Home Care/ '[]'$ Refused Recommended Disposition /'[]'$ [facility]/ '[]'$  Follow-up with PCP ?Additional Notes:  ? ?Recommended ED. Patient requesting neurology referral. Please advise. Future appt already scheduled for 08/11/21 for med refills.  ? ? ? Reason for Disposition ? Patient sounds very sick or weak to the triager ? ?Answer Assessment - Initial Assessment Questions ?1. SYMPTOM: "What is the main symptom you are concerned about?" (e.g., weakness, numbness) ?    Memory deficits and episodes of confusion losing time ?2. ONSET: "When did this start?" (minutes, hours, days; while sleeping) ?    1st episode noted Oct/ Nov and 2nd episode yesterday  ?3. LAST NORMAL: "When was the last time you (the patient) were normal (no symptoms)?" ?    Before Oct. 2022 ?4. PATTERN "Does this come and go, or has it been constant since it started?"  "Is it present now?" ?    Comes and goes not present now  ?5. CARDIAC SYMPTOMS: "Have you had any of the following symptoms: chest pain, difficulty breathing, palpitations?" ?    Denies  ?6. NEUROLOGIC SYMPTOMS: "Have you had any of the following symptoms: headache, dizziness, vision loss, double vision, changes in speech, unsteady on your feet?" ?    Dizziness, lightheaded at times not now , gets confused  and stares out, not now  ?7. OTHER SYMPTOMS: "Do you have any other symptoms?" ?    Episodes of starring into space unresponsive appt 5 minutes. Confusion noted by others  ?8. PREGNANCY: "Is there any chance you are pregnant?" "When was your last menstrual period?" ?    na ? ?Protocols used: Neurologic Deficit-A-AH ? ?

## 2021-08-01 ENCOUNTER — Other Ambulatory Visit: Payer: Self-pay

## 2021-08-01 MED ORDER — HYDROCHLOROTHIAZIDE 25 MG PO TABS
25.0000 mg | ORAL_TABLET | Freq: Every day | ORAL | 1 refills | Status: DC
  Filled 2021-08-01: qty 90, 90d supply, fill #0
  Filled 2021-11-02: qty 30, 30d supply, fill #1
  Filled 2021-12-08: qty 30, 30d supply, fill #2
  Filled 2022-01-11: qty 30, 30d supply, fill #3

## 2021-08-01 MED ORDER — AMLODIPINE BESYLATE 10 MG PO TABS
10.0000 mg | ORAL_TABLET | Freq: Every day | ORAL | 1 refills | Status: DC
Start: 2021-08-01 — End: 2022-02-16
  Filled 2021-08-01: qty 90, 90d supply, fill #0
  Filled 2021-11-02: qty 30, 30d supply, fill #1
  Filled 2021-12-08: qty 30, 30d supply, fill #2
  Filled 2022-01-11: qty 30, 30d supply, fill #3

## 2021-08-01 NOTE — Telephone Encounter (Signed)
Called pt made aware of MD of note and instructions.  ?

## 2021-08-01 NOTE — Telephone Encounter (Signed)
Requested Prescriptions  ?Pending Prescriptions Disp Refills  ?? hydrochlorothiazide (HYDRODIURIL) 25 MG tablet 90 tablet 1  ?  Sig: Take 1 tablet (25 mg total) by mouth daily.  ?  ? Cardiovascular: Diuretics - Thiazide Failed - 07/31/2021 12:56 PM  ?  ?  Failed - Last BP in normal range  ?  BP Readings from Last 1 Encounters:  ?07/31/21 (!) 184/109  ?   ?  ?  Passed - Cr in normal range and within 180 days  ?  Creatinine, Ser  ?Date Value Ref Range Status  ?07/31/2021 0.74 0.44 - 1.00 mg/dL Final  ?   ?  ?  Passed - K in normal range and within 180 days  ?  Potassium  ?Date Value Ref Range Status  ?07/31/2021 3.9 3.5 - 5.1 mmol/L Final  ?   ?  ?  Passed - Na in normal range and within 180 days  ?  Sodium  ?Date Value Ref Range Status  ?07/31/2021 137 135 - 145 mmol/L Final  ?02/11/2021 139 134 - 144 mmol/L Final  ?   ?  ?  Passed - Valid encounter within last 6 months  ?  Recent Outpatient Visits   ?      ? 5 months ago Primary optic atrophy of right eye  ? [place], [place]  ? 7 months ago Essential hypertension  ? [place] Ladell Pier, MD  ? 1 year ago Essential hypertension  ? [place], RPH-CPP  ? 2 years ago Screening for HIV (human immunodeficiency virus)  ? [place], RPH-CPP  ? 2 years ago Essential hypertension  ? [place] Ladell Pier, MD  ?  ?  ?Future Appointments   ?        ? In 1 week Ladell Pier, MD [place]  ?  ? ?  ?  ?  ?? amLODipine (NORVASC) 10 MG tablet 90 tablet 1  ?  Sig: Take 1 tablet (10 mg total) by mouth daily.  ?  ? Cardiovascular: Calcium Channel Blockers 2 Failed - 07/31/2021 12:56 PM  ?  ?  Failed - Last BP in normal range  ?  BP Readings from Last 1 Encounters:  ?07/31/21 (!) 184/109  ?   ?  ?  Passed - Last Heart  Rate in normal range  ?  Pulse Readings from Last 1 Encounters:  ?07/31/21 68  ?   ?  ?  Passed - Valid encounter within last 6 months  ?  Recent Outpatient Visits   ?      ? 5 months ago Primary optic atrophy of right eye  ? [place], [place]  ? 7 months ago Essential hypertension  ? [place], [place]  ? 1 year ago Essential hypertension  ? [place], RPH-CPP  ? 2 years ago Screening for HIV (human immunodeficiency virus)  ? [place], RPH-CPP  ? 2 years ago Essential hypertension  ? [place] Ladell Pier, MD  ?  ?  ?Future Appointments   ?        ?  In 1 week Ladell Pier, MD [place]  ?  ? ?  ?  ?  ? ?

## 2021-08-04 ENCOUNTER — Ambulatory Visit (INDEPENDENT_AMBULATORY_CARE_PROVIDER_SITE_OTHER): Payer: Self-pay | Admitting: Neurology

## 2021-08-04 ENCOUNTER — Other Ambulatory Visit: Payer: Self-pay

## 2021-08-04 ENCOUNTER — Encounter: Payer: Self-pay | Admitting: Neurology

## 2021-08-04 VITALS — BP 161/105 | HR 97 | Ht 59.0 in | Wt 193.0 lb

## 2021-08-04 DIAGNOSIS — R404 Transient alteration of awareness: Secondary | ICD-10-CM

## 2021-08-04 MED ORDER — TOPIRAMATE 25 MG PO TABS
ORAL_TABLET | ORAL | 5 refills | Status: DC
  Filled 2021-08-04: qty 120, 30d supply, fill #0
  Filled 2021-09-09: qty 120, 30d supply, fill #1
  Filled 2021-11-02: qty 120, 30d supply, fill #2
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
## 2021-08-04 NOTE — Patient Instructions (Signed)
Schedule MRI brain with and without contrast ? ?2. Schedule 1-hour EEG ? ?3. Start Topiramate (Topamax) '25mg'$ : Take 1 tablet twice a day for 1 week, then increase to 2 tablets twice a day and continue ? ?4. Keep a calendar of your symptoms ? ?5. Follow-up in 3 months, call for any changes ? ? ?Seizure Precautions: ?1. If medication has been prescribed for you to prevent seizures, take it exactly as directed.  Do not stop taking the medicine without talking to your doctor first, even if you have not had a seizure in a long time.  ? ?2. Avoid activities in which a seizure would cause danger to yourself or to others.  Don't operate dangerous machinery, swim alone, or climb in high or dangerous places, such as on ladders, roofs, or girders.  Do not drive unless your doctor says you may. ? ?3. If you have any warning that you may have a seizure, lay down in a safe place where you can't hurt yourself.   ? ?4.  No driving for 6 months from last seizure, as per [facility].   Please refer to the following link on the [place] website for more information: http://www.epilepsyfoundation.org/answerplace/Social/driving/drivingu.cfm  ? ?5.  Maintain good sleep hygiene. Avoid alcohol. ? ?6.  Contact your doctor if you have any problems that may be related to the medicine you are taking. ? ?7.  Call 911 and bring the patient back to the ED if: ?      ? A.  The seizure lasts longer than 5 minutes.      ? B.  The patient doesn't awaken shortly after the seizure ? C.  The patient has new problems such as difficulty seeing, speaking or moving ? D.  The patient was injured during the seizure ? E.  The patient has a temperature over 102 F (39C) ? F.  The patient vomited and now is having trouble breathing ?      ? ?

## 2021-08-11 ENCOUNTER — Ambulatory Visit: Payer: Self-pay | Attending: Internal Medicine | Admitting: Internal Medicine

## 2021-08-11 ENCOUNTER — Encounter: Payer: Self-pay | Admitting: Internal Medicine

## 2021-08-11 ENCOUNTER — Telehealth: Payer: Self-pay

## 2021-08-11 ENCOUNTER — Other Ambulatory Visit: Payer: Self-pay

## 2021-08-11 VITALS — BP 145/92 | HR 93 | Resp 16 | Wt 191.0 lb

## 2021-08-11 DIAGNOSIS — A599 Trichomoniasis, unspecified: Secondary | ICD-10-CM

## 2021-08-11 DIAGNOSIS — F17200 Nicotine dependence, unspecified, uncomplicated: Secondary | ICD-10-CM

## 2021-08-11 DIAGNOSIS — E782 Mixed hyperlipidemia: Secondary | ICD-10-CM

## 2021-08-11 DIAGNOSIS — I10 Essential (primary) hypertension: Secondary | ICD-10-CM

## 2021-08-11 DIAGNOSIS — G40909 Epilepsy, unspecified, not intractable, without status epilepticus: Secondary | ICD-10-CM

## 2021-08-11 MED ORDER — ATORVASTATIN CALCIUM 10 MG PO TABS
10.0000 mg | ORAL_TABLET | Freq: Every day | ORAL | 1 refills | Status: DC
  Filled 2021-08-11 – 2021-08-18 (×2): qty 90, 90d supply, fill #0
  Filled 2021-12-08: qty 90, 90d supply, fill #1

## 2021-08-11 MED ORDER — LOSARTAN POTASSIUM 25 MG PO TABS
25.0000 mg | ORAL_TABLET | Freq: Every day | ORAL | 4 refills | Status: DC
  Filled 2021-08-11 – 2021-08-18 (×2): qty 30, 30d supply, fill #0
  Filled 2021-11-02: qty 30, 30d supply, fill #1
  Filled 2021-12-08: qty 30, 30d supply, fill #2
  Filled 2022-01-11 – 2022-01-12 (×2): qty 30, 30d supply, fill #3
  Filled 2022-04-02: qty 30, 30d supply, fill #4

## 2021-08-11 MED ORDER — METRONIDAZOLE 500 MG PO TABS
500.0000 mg | ORAL_TABLET | Freq: Two times a day (BID) | ORAL | 0 refills | Status: DC
  Filled 2021-08-11 – 2021-08-18 (×2): qty 14, 7d supply, fill #0

## 2021-08-11 NOTE — Patient Instructions (Signed)
Your blood pressure is not at goal.  Continue Hydrochlorothiazide and Amlodipine.  Restart Losartan 25 mg.   ? ?Start Atorvastatin 10 mg for your cholesterol.   ? ? ?

## 2021-08-11 NOTE — Telephone Encounter (Signed)
Met with the patient when she was in the clinic today.  She explained that she was let go from her job with the [employer] 04/29/2022 and now has no income. She has applied for unemployment through [employer] but has not received a response about her application.   ? ?She currently lives in [place] and said that since loosing her job, they cut her rent and she only needs to pay $50/month.  She does receive some support from her family.  ? ?She receives about $17/month of food stamps but believes this amount will soon increase.  ? ?She inquired about financial assistance with household necessities and food times. She said that she received a box of fresh food today but she is not sure where it came from.  ?I provided her with a list of food pantries and places to obtain food including the [employer], product[employer]process [employer] and Out of the [place] [employer]. She has transportation those events. ? ?She has only been approved for [facility] and was in agreement to placing a referral with [employer] [place] to see if they can assist her with obtaining full Medicaid. In the meantime, she explained that her daughter is working on [employer] and [employer]. She needs to see the neurologist and said that she has to have the financial assistance approved or she will need to pay about $200 just to be seen by the neurologist. Referral faxed to Novella Olive, attorney/LANC.  ? ?

## 2021-08-11 NOTE — Progress Notes (Signed)
? ? ?Patient ID: Marie Ferguson, female    DOB: 09/04/1965  MRN: [id number] ? ?CC: Hospitalization Follow-up (ED) ? ? ?Subjective: ?Marie Ferguson is a 56 y.o. female who presents for chronic ds management and f/u from ER ?Her concerns today include:  ?HTN, HL, hypertensive retinopathy BL with optic disc atrophy on RT (Dr. Schuyler Amor), obesity, tob dep. ? ?Since last visit with me, patient has had 2-3 episodes of impaired awareness for which she was seen in the emergency room 07/31/2021 for her most recent episode.  Blood pressure on that visit was found to be 240/137.  Patient tells me she had not been taking her blood pressure medications but since then she was able to get her medicines and are taking them.  CT of the head was negative except for empty sella.  UA revealed incidental finding of trichomonas. ?-Saw urologist Dr. Delice Lesch on 08/04/2021 for follow-up of these staring unresponsive spells.  Thought to have focal impaired awareness seizures.  MRI and EEG ordered.  Patient started on Topamax.  Reports compliance with taking Topamax.  She has not had any spells since that last ER visit. ?-Has not worked since 04/29/2022.  Job let her go after 31 yrs.  Reports she was having episodes of lightheadedness.  Does not drive and had to take Melburn Popper to work ? ? ?HTN:  -suppose to be on Cozaar 25 mg, Norvasc 10 mg and HCTZ 25 mg once a day.  Does not have Cozaar. Took other 2 meds already for today.  Also suppose to be on Lipitor but pt does not have it.  LDL was 187 12/2020 ?-Admits she does not limit salt in foods. Eats a lot of fast foods and microwave foods ?-currently still smoking but not as much as before.  Smokes about 3 cigarettes a wk due to lack of funds and because of her health ? ?Pos Trichomonas on UA:  not sexually active for past 15 yrs.  No vaginal dischg. No dysuria and no blood in the urine ?Patient Active Problem List  ? Diagnosis Date Noted  ? Seizure-like activity ([place]) 02/12/2021  ? Prediabetes 07/30/2019  ?  Intraductal papilloma of left breast 06/22/2019  ? Screening breast examination 12/29/2018  ? Breast pain, left 12/29/2018  ? Hyperlipidemia, mixed 12/08/2018  ? Hypertensive retinopathy of both eyes 11/07/2018  ? Essential hypertension 11/07/2018  ? Primary optic atrophy of right eye 11/07/2018  ? Tobacco dependence 11/07/2018  ? Obesity (BMI 30-39.9) 11/07/2018  ? Breast mass in female 11/07/2018  ?  ? ?Current Outpatient Medications on File Prior to Visit  ?Medication Sig Dispense Refill  ? acetaminophen (TYLENOL) 325 MG tablet Take 650 mg by mouth every 6 (six) hours as needed.    ? amLODipine (NORVASC) 10 MG tablet Take 1 tablet (10 mg total) by mouth daily. 90 tablet 1  ? hydrochlorothiazide (HYDRODIURIL) 25 MG tablet Take 1 tablet (25 mg total) by mouth daily. 90 tablet 1  ? meclizine (ANTIVERT) 25 MG tablet Take 1 tablet (25 mg total) by mouth 3 (three) times daily as needed for dizziness. 30 tablet 0  ? OVER THE COUNTER MEDICATION Place 1 drop into both eyes daily as needed (irritation). Family care - lubricating eye drops.    ? topiramate (TOPAMAX) 25 MG tablet Take 1 tablet twice a day for 1 week, then increase to 2 tablets twice a day 120 tablet 5  ? ?No current facility-administered medications on file prior to visit.  ? ? ?No Known Allergies ? ?  Social History  ? ?Socioeconomic History  ? Marital status: Single  ?  Spouse name: Not on file  ? Number of children: 3  ? Years of education: 1 yr community college  ? Highest education level: Some college, no degree  ?Occupational History  ? Occupation: [employer]  ?Tobacco Use  ? Smoking status: Every Day  ?  Packs/day: 0.25  ?  Types: Cigarettes  ? Smokeless tobacco: Never  ? Tobacco comments:  ?  5 cigs/ day  ?Vaping Use  ? Vaping Use: Never used  ?Substance and Sexual Activity  ? Alcohol use: Yes  ?  Comment: rarely  ? Drug use: Yes  ?  Types: Marijuana  ?  Comment: 1x a week  ? Sexual activity: Not Currently  ?Other Topics Concern  ? Not on file   ?Social History Narrative  ? Right handed   ? ?Social Determinants of Health  ? ?Financial Resource Strain: Not on file  ?Food Insecurity: Not on file  ?Transportation Needs: Not on file  ?Physical Activity: Not on file  ?Stress: Not on file  ?Social Connections: Not on file  ?Intimate Partner Violence: Not on file  ? ? ?Family History  ?Problem Relation Age of Onset  ? Hypertension Mother   ? Hypertension Father   ? Heart attack Brother   ? Diabetes Maternal Aunt   ?     x2  ? Diabetes Maternal Uncle   ? ? ?Past Surgical History:  ?Procedure Laterality Date  ? ABDOMINAL HYSTERECTOMY    ? BREAST LUMPECTOMY WITH RADIOACTIVE SEED LOCALIZATION Left 03/03/2019  ? Procedure: LEFT BREAST LUMPECTOMY X 2 WITH 2 RADIOACTIVE SEED LOCALIZATION;  Surgeon: Jovita Kussmaul, MD;  Location: [place];  Service: General;  Laterality: Left;  ? CESAREAN SECTION    ? x 3  ? COLONOSCOPY  07/21/2019  ? POLYPECTOMY  07/21/2019  ? ? ?ROS: ?Review of Systems ?Negative except as stated above ? ?PHYSICAL EXAM: ?BP (!) 145/92   Pulse 93   Resp 16   Wt 191 lb (86.6 kg)   SpO2 97%   BMI 38.58 kg/m?   ?Wt Readings from Last 3 Encounters:  ?08/11/21 191 lb (86.6 kg)  ?08/04/21 193 lb (87.5 kg)  ?07/31/21 199 lb (90.3 kg)  ? ? ?Physical Exam ? ?General appearance - alert, well appearing, and in no distress ?Mental status - normal mood, behavior, speech, dress, motor activity, and thought processes ?Neck - supple, no significant adenopathy ?Chest - clear to auscultation, no wheezes, rales or rhonchi, symmetric air entry ?Heart - normal rate, regular rhythm, normal S1, S2, no murmurs, rubs, clicks or gallops ?Extremities - peripheral pulses normal, no pedal edema, no clubbing or cyanosis ? ? ? ?  Latest Ref Rng & Units 07/31/2021  ?  2:58 PM 02/11/2021  ? 11:08 AM 12/24/2020  ? 12:01 PM  ?CMP  ?Glucose 70 - 99 mg/dL 106   81   99    ?BUN 6 - 20 mg/dL '13   10   20    '$ ?Creatinine 0.44 - 1.00 mg/dL 0.74   0.65   0.93    ?Sodium 135 -  145 mmol/L 137   139   134    ?Potassium 3.5 - 5.1 mmol/L 3.9   4.5   4.4    ?Chloride 98 - 111 mmol/L 105   100   93    ?CO2 22 - 32 mmol/L 24    23    ?  Calcium 8.9 - 10.3 mg/dL 8.8   9.9   10.1    ?Total Protein 6.0 - 8.5 g/dL  7.6   9.0    ?Total Bilirubin 0.0 - 1.2 mg/dL  0.3   0.6    ?Alkaline Phos 44 - 121 IU/L  119   147    ?AST 0 - 40 IU/L  13   23    ?ALT 0 - 32 IU/L   19    ? ?Lipid Panel  ?   ?Component Value Date/Time  ? CHOL 255 (H) 12/24/2020 1201  ? TRIG 101 12/24/2020 1201  ? HDL 50 12/24/2020 1201  ? CHOLHDL 5.1 (H) 12/24/2020 1201  ? LDLCALC 187 (H) 12/24/2020 1201  ? ? ?CBC ?   ?Component Value Date/Time  ? WBC 5.3 07/31/2021 1458  ? RBC 4.74 07/31/2021 1458  ? HGB 13.7 07/31/2021 1458  ? HGB 13.0 02/11/2021 1108  ? HCT 41.4 07/31/2021 1458  ? HCT 38.1 02/11/2021 1108  ? PLT 369 07/31/2021 1458  ? PLT 382 02/11/2021 1108  ? MCV 87.3 07/31/2021 1458  ? MCV 86 02/11/2021 1108  ? MCH 28.9 07/31/2021 1458  ? MCHC 33.1 07/31/2021 1458  ? RDW 12.8 07/31/2021 1458  ? RDW 12.8 02/11/2021 1108  ? LYMPHSABS 1.8 02/11/2021 1108  ? MONOABS 0.5 10/29/2018 1253  ? EOSABS 0.1 02/11/2021 1108  ? BASOSABS 0.0 02/11/2021 1108  ? ? ?ASSESSMENT AND PLAN: ?1. Essential hypertension ?Not at goal of 130/80 or lower.  Continue Norvasc and HCTZ.  Add Cozaar.  Follow-up with clinical pharmacist in 2 weeks for repeat blood pressure check.  At that time we should check a BMP. ?- losartan (COZAAR) 25 MG tablet; Take 1 tablet (25 mg total) by mouth daily.  Dispense: 30 tablet; Refill: 4 ? ?2. Seizure disorder ([place]) ?Episode sounds like absence seizures.  She has seen and is being followed by neurology.  MRI of the head and EEG have been ordered. ?Patient inquired about whether Social Security office offer any assistance with food items and other household items.  I requested that our caseworker speak with her to assess need and give advice. ? ?3. Tobacco dependence ?Pt is current smoker. ?Patient advised to quit  smoking. ?Discussed health risks associated with smoking including lung and other types of cancers, chronic lung diseases and CV risks.[name] ?Pt ready ready to give trail of quitting.   ?Discussed methods to help quit including quittin

## 2021-08-18 ENCOUNTER — Other Ambulatory Visit: Payer: Self-pay

## 2021-08-20 ENCOUNTER — Ambulatory Visit (INDEPENDENT_AMBULATORY_CARE_PROVIDER_SITE_OTHER): Payer: Self-pay | Admitting: Neurology

## 2021-08-20 DIAGNOSIS — R404 Transient alteration of awareness: Secondary | ICD-10-CM

## 2021-08-20 NOTE — Procedures (Signed)
ELECTROENCEPHALOGRAM REPORT ? ?Date of Study: 08/20/2021 ? ?Patient's Name: Marie Ferguson ?MRN: [id number] ?Date of Birth: 01-18-1966 ? ?Referring Provider: Dr. Ellouise Newer ? ?Clinical History: This is a 56 year old woman with recurrent staring spells. EEG for classification. ? ?Medications: ?Topiramate ?TYLENOL 325 MG tablet ?NORVASC 10 MG tablet ?LIPITOR 10 MG tablet ?HYDRODIURIL 25 MG tablet ?COZAAR 25 MG tablet ?ANTIVERT 25 MG tablet ?lubricating eye drops ? ?Technical Summary: ?A multichannel digital 1-hour EEG recording measured by the international 10-20 system with electrodes applied with paste and impedances below 5000 ohms performed in our laboratory with EKG monitoring in an awake and asleep patient.  Hyperventilation and photic stimulation were performed.  The digital EEG was referentially recorded, reformatted, and digitally filtered in a variety of bipolar and referential montages for optimal display.   ? ?Description: ?The patient is awake and asleep during the recording.  During maximal wakefulness, there is a symmetric, medium voltage 10 Hz posterior dominant rhythm that attenuates with eye opening.  The record is symmetric.  During drowsiness and sleep, there is an increase in theta slowing of the background with shifting asymmetry over the bilateral temporal regions.  Vertex waves were seen.  Hyperventilation and photic stimulation did not elicit any abnormalities.  There were no epileptiform discharges or electrographic seizures seen.   ? ?EKG lead was unremarkable. ? ?Impression: ?This 1-hour awake and asleep EEG is within normal limits. ? ?Clinical Correlation: ?A normal EEG does not exclude a clinical diagnosis of epilepsy.  If further clinical questions remain, prolonged EEG may be helpful.  Clinical correlation is advised. ? ? ?Ellouise Newer, M.D. ? ?

## 2021-08-22 ENCOUNTER — Ambulatory Visit
Admission: RE | Admit: 2021-08-22 | Discharge: 2021-08-22 | Disposition: A | Discharge status: Home / Self Care | Payer: Self-pay | Source: Ambulatory Visit | Attending: Neurology | Admitting: Neurology

## 2021-08-22 ENCOUNTER — Telehealth: Payer: Self-pay

## 2021-08-22 DIAGNOSIS — R404 Transient alteration of awareness: Secondary | ICD-10-CM

## 2021-08-22 MED ORDER — GADOBENATE DIMEGLUMINE 529 MG/ML IV SOLN
20.0000 mL | Freq: Once | INTRAVENOUS | Status: AC | PRN
  Administered 2021-08-22: 20 mL via INTRAVENOUS

## 2021-08-22 NOTE — Telephone Encounter (Signed)
Stat reading [facility], sending to Dr.Aquino for resulting.  ?

## 2021-08-26 ENCOUNTER — Telehealth: Payer: Self-pay | Admitting: Neurology

## 2021-08-26 NOTE — Telephone Encounter (Signed)
See other phone note

## 2021-08-26 NOTE — Telephone Encounter (Signed)
Left VM. Per DPR: allowed to speak to patient only. ?

## 2021-09-02 NOTE — Telephone Encounter (Signed)
Spoke to patient. Discussed MRI findings, no intracranial abnormalities. There were changes seen in the right optic nerve, discussed this with patient, she has had the right eye vision changes for 15 years, there is no vision at all in her right eye. It appears these findings are chronic, we agreed to monitor symptoms on the left eye, and if she notices any issues, we can proceed with MRI orbits at that point. EEG normal. She has started the Topamax with no issues. She has been denied Medicaid and is working with the hospital on health insurance and applying for disability. ?

## 2021-09-10 ENCOUNTER — Other Ambulatory Visit: Payer: Self-pay

## 2021-11-03 ENCOUNTER — Other Ambulatory Visit: Payer: Self-pay

## 2021-11-07 ENCOUNTER — Encounter: Payer: Self-pay | Admitting: Neurology

## 2021-11-07 ENCOUNTER — Ambulatory Visit (INDEPENDENT_AMBULATORY_CARE_PROVIDER_SITE_OTHER): Payer: Self-pay | Admitting: Neurology

## 2021-11-07 ENCOUNTER — Other Ambulatory Visit: Payer: Self-pay

## 2021-11-07 VITALS — BP 143/85 | HR 92 | Ht 59.5 in | Wt 197.0 lb

## 2021-11-07 DIAGNOSIS — G40009 Localization-related (focal) (partial) idiopathic epilepsy and epileptic syndromes with seizures of localized onset, not intractable, without status epilepticus: Secondary | ICD-10-CM

## 2021-11-07 MED ORDER — TOPIRAMATE 25 MG PO TABS
ORAL_TABLET | ORAL | 11 refills | Status: DC
  Filled 2021-11-07: qty 120, fill #0
  Filled 2021-12-08: qty 120, 30d supply, fill #0
  Filled 2022-01-11: qty 120, 30d supply, fill #1
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
## 2021-11-07 NOTE — Patient Instructions (Signed)
Always good to see you.  Continue Topiramate '25mg'$ : take 2 tablets twice a day  2. If any change in symptoms such as recurrence of staring spells, please call our office and we will increase the dose of your medication  3. Follow-up in 6 months, call for any changes   Seizure Precautions: 1. If medication has been prescribed for you to prevent seizures, take it exactly as directed.  Do not stop taking the medicine without talking to your doctor first, even if you have not had a seizure in a long time.   2. Avoid activities in which a seizure would cause danger to yourself or to others.  Don't operate dangerous machinery, swim alone, or climb in high or dangerous places, such as on ladders, roofs, or girders.  Do not drive unless your doctor says you may.  3. If you have any warning that you may have a seizure, lay down in a safe place where you can't hurt yourself.    4.  No driving for 6 months from last seizure, as per [facility].   Please refer to the following link on the [facility] website for more information: http://[email or web address]/[email or web address]/drivingu.cfm   5.  Maintain good sleep hygiene. Avoid alcohol.  6.  Contact your doctor if you have any problems that may be related to the medicine you are taking.  7.  Call 911 and bring the patient back to the ED if:        A.  The seizure lasts longer than 5 minutes.       B.  The patient doesn't awaken shortly after the seizure  C.  The patient has new problems such as difficulty seeing, speaking or moving  D.  The patient was injured during the seizure  E.  The patient has a temperature over 102 F (39C)  F.  The patient vomited and now is having trouble breathing

## 2021-12-08 ENCOUNTER — Other Ambulatory Visit: Payer: Self-pay

## 2022-01-12 ENCOUNTER — Other Ambulatory Visit: Payer: Self-pay

## 2022-02-10 ENCOUNTER — Telehealth: Payer: Self-pay | Admitting: Neurology

## 2022-02-10 NOTE — Telephone Encounter (Signed)
Pt called in and left a message with the access nurse. She stated she has been taking her medicine, but yesterday she had another seizure. "when she came to herself she was dressed but she was on the 7th floor at the lobby sitting at the table and she says she went to her apt when she got herself together she took her medicine and nothing has happened today". She wanted to let Dr. Delice Lesch know

## 2022-02-10 NOTE — Telephone Encounter (Signed)
We had talked about increasing her medication if she has another seizure. Pls let her know I would like to increase Topiramate to '100mg'$  twice a day. It will be a new Rx for '100mg'$  tabs: take 1 tablet BID. Pls send in updated Rx, thanks

## 2022-02-10 NOTE — Telephone Encounter (Signed)
Pt c/o: seizure Missed medications?  No. Sleep deprived?  No. Alcohol intake?  Yes.   But she said it was a few weeks ago Increased stress? No. Any trigger? No  Any change in medication color or shape? No. Back to their usual baseline self?  Yes.  . If no, advise go to ER Current medications prescribed by Dr. Delice Lesch: topiramate 25 mg Take 2 tablets by mouth twice a day

## 2022-02-11 ENCOUNTER — Other Ambulatory Visit: Payer: Self-pay

## 2022-02-11 MED ORDER — TOPIRAMATE 100 MG PO TABS
100.0000 mg | ORAL_TABLET | Freq: Two times a day (BID) | ORAL | 3 refills | Status: DC
  Filled 2022-02-11: qty 60, 30d supply, fill #0
  Filled 2022-04-02: qty 60, 30d supply, fill #1
  Filled 2022-05-01 (×2): qty 60, 30d supply, fill #2

## 2022-02-11 NOTE — Telephone Encounter (Signed)
Pt Called informed she and Dr Delice Lesch had talked about increasing her medication if she has another seizure. Pls let her know Dr Delice Lesch  would like to increase Topiramate to '100mg'$  twice a day. It will be a new Rx for '100mg'$  tabs: take 1 tablet BID

## 2022-02-12 ENCOUNTER — Other Ambulatory Visit: Payer: Self-pay

## 2022-02-16 ENCOUNTER — Other Ambulatory Visit: Payer: Self-pay | Admitting: Internal Medicine

## 2022-02-16 DIAGNOSIS — I10 Essential (primary) hypertension: Secondary | ICD-10-CM

## 2022-02-16 NOTE — Telephone Encounter (Signed)
Medication Refill - Medication: Amlodipine and Hydrochlorothiazide   Has the patient contacted their pharmacy? Yes.   (Agent: If no, request that the patient contact the pharmacy for the refill. If patient does not wish to contact the pharmacy document the reason why and proceed with request.) (Agent: If yes, when and what did the pharmacy advise?)  Preferred Pharmacy (with phone number or street name): [place] Has the patient been seen for an appointment in the last year OR does the patient have an upcoming appointment? y  Agent: Please be advised that RX refills may take up to 3 business days. We ask that you follow-up with your pharmacy.

## 2022-02-17 ENCOUNTER — Other Ambulatory Visit: Payer: Self-pay

## 2022-02-17 MED ORDER — HYDROCHLOROTHIAZIDE 25 MG PO TABS
25.0000 mg | ORAL_TABLET | Freq: Every day | ORAL | 0 refills | Status: DC
  Filled 2022-02-17: qty 30, 30d supply, fill #0

## 2022-02-17 MED ORDER — AMLODIPINE BESYLATE 10 MG PO TABS
10.0000 mg | ORAL_TABLET | Freq: Every day | ORAL | 0 refills | Status: DC
  Filled 2022-02-17 (×2): qty 30, 30d supply, fill #0

## 2022-02-17 NOTE — Telephone Encounter (Signed)
Requested medication (s) are due for refill today:yes  Requested medication (s) are on the active medication list:yes  Last refill:  08/01/21  Future visit scheduled: no  Notes to clinic:  Unable to refill per protocol, appointment needed.  Routing for approval.     Requested Prescriptions  Pending Prescriptions Disp Refills   hydrochlorothiazide (HYDRODIURIL) 25 MG tablet 90 tablet 1    Sig: Take 1 tablet (25 mg total) by mouth daily.     Cardiovascular: Diuretics - Thiazide Failed - 02/16/2022  5:29 PM      Failed - Cr in normal range and within 180 days    Creatinine, Ser  Date Value Ref Range Status  07/31/2021 0.74 0.44 - 1.00 mg/dL Final         Failed - K in normal range and within 180 days    Potassium  Date Value Ref Range Status  07/31/2021 3.9 3.5 - 5.1 mmol/L Final         Failed - Na in normal range and within 180 days    Sodium  Date Value Ref Range Status  07/31/2021 137 135 - 145 mmol/L Final  02/11/2021 139 134 - 144 mmol/L Final         Failed - Last BP in normal range    BP Readings from Last 1 Encounters:  11/07/21 (!) 143/85         Failed - Valid encounter within last 6 months    Recent Outpatient Visits           6 months ago Essential hypertension   [place], Deborah B, MD   1 year ago Primary optic atrophy of right eye   [place], [place]   1 year ago Essential hypertension   [place], Deborah B, MD   2 years ago Essential hypertension   [place], R[facility]-CPP   2 years ago Screening for HIV (human immunodeficiency virus)   [place], Stephen L, RPH-CPP               amLODipine (NORVASC) 10 MG tablet 90 tablet 1    Sig: Take 1 tablet (10 mg total) by mouth daily.     Cardiovascular: Calcium Channel Blockers 2  Failed - 02/16/2022  5:29 PM      Failed - Last BP in normal range    BP Readings from Last 1 Encounters:  11/07/21 (!) 143/85         Failed - Valid encounter within last 6 months    Recent Outpatient Visits           6 months ago Essential hypertension   [place], MD   1 year ago Primary optic atrophy of right eye   [place], [place], [place]   1 year ago Essential hypertension   [place], MD   2 years ago Essential hypertension   [place], RPH[facility]   2 years ago Screening for HIV (human immunodeficiency virus)   [place], Jarome Matin, RPH-CPP              Passed - Last Heart Rate in normal  range    Pulse Readings from Last 1 Encounters:  11/07/21 92

## 2022-02-20 ENCOUNTER — Other Ambulatory Visit: Payer: Self-pay

## 2022-04-02 ENCOUNTER — Other Ambulatory Visit: Payer: Self-pay

## 2022-04-02 ENCOUNTER — Other Ambulatory Visit: Payer: Self-pay | Admitting: Internal Medicine

## 2022-04-02 DIAGNOSIS — E782 Mixed hyperlipidemia: Secondary | ICD-10-CM

## 2022-04-02 DIAGNOSIS — I10 Essential (primary) hypertension: Secondary | ICD-10-CM

## 2022-04-02 NOTE — Telephone Encounter (Signed)
Requested medications are due for refill today.  yes  Requested medications are on the active medications list.  yes  Last refill. varied  Future visit scheduled.   no  Notes to clinic.  Labs are expired. Pt needs an office visit.    Requested Prescriptions  Pending Prescriptions Disp Refills   atorvastatin (LIPITOR) 10 MG tablet 90 tablet 1    Sig: Take 1 tablet (10 mg total) by mouth daily.     Cardiovascular:  Antilipid - Statins Failed - 04/02/2022  1:28 PM      Failed - Lipid Panel in normal range within the last 12 months    Cholesterol, Total  Date Value Ref Range Status  12/24/2020 255 (H) 100 - 199 mg/dL Final   LDL Chol Calc (NIH)  Date Value Ref Range Status  12/24/2020 187 (H) 0 - 99 mg/dL Final   HDL  Date Value Ref Range Status  12/24/2020 50 >39 mg/dL Final   Triglycerides  Date Value Ref Range Status  12/24/2020 101 0 - 149 mg/dL Final         Passed - Patient is not pregnant      Passed - Valid encounter within last 12 months    Recent Outpatient Visits           7 months ago Essential hypertension   [place], Deborah B, MD   1 year ago Primary optic atrophy of right eye   [place], Cari S, [place]   1 year ago Essential hypertension   [place], Deborah B, MD   2 years ago Essential hypertension   [place], RPH-CPP   2 years ago Screening for HIV (human immunodeficiency virus)   [place], Stephen L, RPH-CPP               hydrochlorothiazide (HYDRODIURIL) 25 MG tablet 30 tablet 0    Sig: Take 1 tablet (25 mg total) by mouth daily.     Cardiovascular: Diuretics - Thiazide Failed - 04/02/2022  1:28 PM      Failed - Cr in normal range and within 180 days    Creatinine, Ser  Date Value Ref Range Status  07/31/2021 0.74  0.44 - 1.00 mg/dL Final         Failed - K in normal range and within 180 days    Potassium  Date Value Ref Range Status  07/31/2021 3.9 3.5 - 5.1 mmol/L Final         Failed - Na in normal range and within 180 days    Sodium  Date Value Ref Range Status  07/31/2021 137 135 - 145 mmol/L Final  02/11/2021 139 134 - 144 mmol/L Final         Failed - Last BP in normal range    BP Readings from Last 1 Encounters:  11/07/21 (!) 143/85         Failed - Valid encounter within last 6 months    Recent Outpatient Visits           7 months ago Essential hypertension   [place], MD   1 year ago Primary optic atrophy of right eye   [place], [place], [place]   1 year ago Essential hypertension  [place] Ladell Pier, MD   2 years ago Essential hypertension   [place], RPH-CPP   2 years ago Screening for HIV (human immunodeficiency virus)   [place], Annie Main L, RPH-CPP               amLODipine (NORVASC) 10 MG tablet 30 tablet 0    Sig: Take 1 tablet (10 mg total) by mouth daily.     Cardiovascular: Calcium Channel Blockers 2 Failed - 04/02/2022  1:28 PM      Failed - Last BP in normal range    BP Readings from Last 1 Encounters:  11/07/21 (!) 143/85         Failed - Valid encounter within last 6 months    Recent Outpatient Visits           7 months ago Essential hypertension   [place], MD   1 year ago Primary optic atrophy of right eye   [place], [place]   1 year ago Essential hypertension   [place], MD   2 years ago Essential hypertension   [place], RPH-CPP   2 years ago Screening for HIV (human immunodeficiency virus)   [place], Jarome Matin, RPH-CPP              Passed - Last Heart Rate in normal range    Pulse Readings from Last 1 Encounters:  11/07/21 92

## 2022-04-03 ENCOUNTER — Other Ambulatory Visit: Payer: Self-pay

## 2022-04-20 ENCOUNTER — Other Ambulatory Visit: Payer: Self-pay

## 2022-05-01 ENCOUNTER — Other Ambulatory Visit: Payer: Self-pay | Admitting: Internal Medicine

## 2022-05-01 ENCOUNTER — Ambulatory Visit: Payer: Self-pay

## 2022-05-01 ENCOUNTER — Other Ambulatory Visit: Payer: Self-pay

## 2022-05-01 DIAGNOSIS — E782 Mixed hyperlipidemia: Secondary | ICD-10-CM

## 2022-05-01 DIAGNOSIS — I10 Essential (primary) hypertension: Secondary | ICD-10-CM

## 2022-05-01 MED ORDER — AMLODIPINE BESYLATE 10 MG PO TABS
10.0000 mg | ORAL_TABLET | Freq: Every day | ORAL | 0 refills | Status: DC
  Filled 2022-05-01: qty 30, 30d supply, fill #0

## 2022-05-01 MED ORDER — ATORVASTATIN CALCIUM 10 MG PO TABS
10.0000 mg | ORAL_TABLET | Freq: Every day | ORAL | 0 refills | Status: DC
  Filled 2022-05-01: qty 30, 30d supply, fill #0

## 2022-05-01 MED ORDER — HYDROCHLOROTHIAZIDE 25 MG PO TABS
25.0000 mg | ORAL_TABLET | Freq: Every day | ORAL | 0 refills | Status: DC
  Filled 2022-05-01: qty 30, 30d supply, fill #0

## 2022-05-01 MED ORDER — LOSARTAN POTASSIUM 25 MG PO TABS
25.0000 mg | ORAL_TABLET | Freq: Every day | ORAL | 0 refills | Status: DC
  Filled 2022-05-01: qty 30, 30d supply, fill #0

## 2022-05-01 NOTE — Telephone Encounter (Signed)
Pt called in to follow up on her medication refills pt scheduled appointment for first availble 05/28/2022.  Pt requesting courtesy refill before her appointment.

## 2022-05-01 NOTE — Telephone Encounter (Signed)
Chief Complaint: High BP  Symptoms: None today; earlier in the week had a seizure and fall, did not seek treatment Frequency: out of BP medications since November, BP has been high for a while she states Pertinent Negatives: Patient denies symptoms at this time Disposition: '[]'$ ED /'[]'$ Urgent Care (no appt availability in office) / '[]'$ Appointment(In office/virtual)/ '[]'$  [facility]/ '[]'$ Home Care/ '[]'$ Refused Recommended Disposition /'[]'$ [facility]/ '[]'$  Follow-up with PCP Additional Notes: Patient's BP 173/119 while on the phone. She says she has been out of amlodipine and HCTZ since November after receiving a 30 day supply. She said she has now scheduled an OV for January and will need her refills. I asked is she able to get to the pharmacy to pick up the refills sent in today for a 30 day supply. She says her son will pick them up when he gets off work and bring to her around 5 pm. I advised since no symptoms and missed medications, take her medications when she receives them after checking and writing down her BP. I advised I will call her back at 6 pm to check on the BP after taking her medications. She verbalized understanding.  Reason for Disposition  [1] Systolic BP  >= 025 OR Diastolic >= 852 AND [7] missed most recent dose of blood pressure medication  Answer Assessment - Initial Assessment Questions 1. BLOOD PRESSURE: "What is the blood pressure?" "Did you take at least two measurements 5 minutes apart?"     197/112 2. ONSET: "When did you take your blood pressure?"     This morning around 1030 3. HOW: "How did you take your blood pressure?" (e.g., automatic home BP monitor, visiting nurse)     Automatic home BP monitor 4. HISTORY: "Do you have a history of high blood pressure?"     Yes 5. MEDICINES: "Are you taking any medicines for blood pressure?" "Have you missed any doses recently?"     Out of BP meds since November (hydrochlorothiazide 25 mg, amlodipine 10 mg) 6.  OTHER SYMPTOMS: "Do you have any symptoms?" (e.g., blurred vision, chest pain, difficulty breathing, headache, weakness)     No symptoms  Protocols used: Blood Pressure - High-A-AH

## 2022-05-01 NOTE — Telephone Encounter (Signed)
Patient called and she says she took her medications close to 1730 (amlodipine, HCTZ, losartan). Her BP is now 158/118 P 66. Advised to continue recording BP daily and taking medications daily. If SBP >160's-170's, DBP >100's, go to the ED for evaluation of BP. She is still denying any symptoms. Patient verbalized understanding.

## 2022-05-05 NOTE — Telephone Encounter (Signed)
Call place for follow-

## 2022-05-06 NOTE — Telephone Encounter (Signed)
Patient advised to keep appointment with Dr. Wynetta Emery and to continue to take blood pressure medication as prescribed.

## 2022-05-28 ENCOUNTER — Encounter: Payer: Self-pay | Admitting: Physician Assistant

## 2022-05-28 ENCOUNTER — Other Ambulatory Visit: Payer: Self-pay

## 2022-05-28 ENCOUNTER — Ambulatory Visit: Payer: Medicaid Other | Attending: Physician Assistant | Admitting: Physician Assistant

## 2022-05-28 VITALS — BP 122/78 | HR 88 | Ht 59.0 in | Wt 206.0 lb

## 2022-05-28 DIAGNOSIS — R569 Unspecified convulsions: Secondary | ICD-10-CM | POA: Diagnosis not present

## 2022-05-28 DIAGNOSIS — Z79899 Other long term (current) drug therapy: Secondary | ICD-10-CM | POA: Diagnosis not present

## 2022-05-28 DIAGNOSIS — I10 Essential (primary) hypertension: Secondary | ICD-10-CM

## 2022-05-28 DIAGNOSIS — R7303 Prediabetes: Secondary | ICD-10-CM | POA: Diagnosis not present

## 2022-05-28 DIAGNOSIS — E782 Mixed hyperlipidemia: Secondary | ICD-10-CM | POA: Diagnosis not present

## 2022-05-28 MED ORDER — ATORVASTATIN CALCIUM 10 MG PO TABS
10.0000 mg | ORAL_TABLET | Freq: Every day | ORAL | 1 refills | Status: DC
  Filled 2022-05-28: qty 90, 90d supply, fill #0

## 2022-05-28 MED ORDER — AMLODIPINE BESYLATE 10 MG PO TABS
10.0000 mg | ORAL_TABLET | Freq: Every day | ORAL | 1 refills | Status: DC
  Filled 2022-05-28: qty 90, 90d supply, fill #0
  Filled 2022-10-01: qty 90, 90d supply, fill #1

## 2022-05-28 MED ORDER — TOPIRAMATE 100 MG PO TABS
100.0000 mg | ORAL_TABLET | Freq: Two times a day (BID) | ORAL | 1 refills | Status: DC
  Filled 2022-05-28: qty 180, 90d supply, fill #0

## 2022-05-28 MED ORDER — LOSARTAN POTASSIUM 25 MG PO TABS
25.0000 mg | ORAL_TABLET | Freq: Every day | ORAL | 1 refills | Status: DC
  Filled 2022-05-28: qty 90, 90d supply, fill #0
  Filled 2022-11-17: qty 90, 90d supply, fill #1

## 2022-05-28 MED ORDER — HYDROCHLOROTHIAZIDE 25 MG PO TABS
25.0000 mg | ORAL_TABLET | Freq: Every day | ORAL | 1 refills | Status: DC
  Filled 2022-05-28: qty 90, 90d supply, fill #0
  Filled 2022-10-01: qty 90, 90d supply, fill #1

## 2022-05-28 NOTE — Patient Instructions (Signed)
Goal blood pressures are <130/<85
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
## 2022-05-29 LAB — LIPID PANEL
Chol/HDL Ratio: 3.8 ratio (ref 0.0–4.4)
Cholesterol, Total: 195 mg/dL (ref 100–199)
HDL: 51 mg/dL (ref >39)
LDL Chol Calc (NIH): 121 mg/dL — ABNORMAL HIGH (ref 0–99)
Triglycerides: 128 mg/dL (ref 0–149)
VLDL Cholesterol Cal: 23 mg/dL (ref 5–40)

## 2022-05-29 LAB — HEMOGLOBIN A1C
Est. average glucose Bld gHb Est-mCnc: 117 mg/dL
Hgb A1c MFr Bld: 5.7 % — ABNORMAL HIGH (ref 4.8–5.6)

## 2022-05-29 LAB — COMPREHENSIVE METABOLIC PANEL
ALT: 10 IU/L (ref 0–32)
AST: 11 IU/L (ref 0–40)
Albumin/Globulin Ratio: 1.2 (ref 1.2–2.2)
Albumin: 4.5 g/dL (ref 3.8–4.9)
Alkaline Phosphatase: 182 IU/L — ABNORMAL HIGH (ref 44–121)
BUN/Creatinine Ratio: 21 (ref 9–23)
BUN: 16 mg/dL (ref 6–24)
Bilirubin Total: 0.3 mg/dL (ref 0.0–1.2)
CO2: 21 mmol/L (ref 20–29)
Calcium: 9.8 mg/dL (ref 8.7–10.2)
Chloride: 99 mmol/L (ref 96–106)
Creatinine, Ser: 0.77 mg/dL (ref 0.57–1.00)
Globulin, Total: 3.7 g/dL (ref 1.5–4.5)
Glucose: 87 mg/dL (ref 70–99)
Potassium: 4.1 mmol/L (ref 3.5–5.2)
Sodium: 137 mmol/L (ref 134–144)
Total Protein: 8.2 g/dL (ref 6.0–8.5)
eGFR: 90 mL/min/{1.73_m2} (ref >59)

## 2022-06-02 ENCOUNTER — Ambulatory Visit (INDEPENDENT_AMBULATORY_CARE_PROVIDER_SITE_OTHER): Payer: Medicaid Other | Admitting: Neurology

## 2022-06-02 ENCOUNTER — Other Ambulatory Visit: Payer: Self-pay

## 2022-06-02 ENCOUNTER — Encounter: Payer: Self-pay | Admitting: Neurology

## 2022-06-02 VITALS — BP 133/87 | HR 90 | Ht 59.0 in | Wt 202.8 lb

## 2022-06-02 DIAGNOSIS — G40009 Localization-related (focal) (partial) idiopathic epilepsy and epileptic syndromes with seizures of localized onset, not intractable, without status epilepticus: Secondary | ICD-10-CM | POA: Diagnosis not present

## 2022-06-02 MED ORDER — TOPIRAMATE 200 MG PO TABS
200.0000 mg | ORAL_TABLET | Freq: Two times a day (BID) | ORAL | 3 refills | Status: DC
  Filled 2022-06-02 – 2022-08-10 (×2): qty 180, 90d supply, fill #0

## 2022-06-02 NOTE — Patient Instructions (Signed)
Always good to see you.  Increase Topiramate to '200mg'$  twice a day. With your current bottle of Topiramate '100mg'$ : Take 2 tablets twice a day. After finishing this, your new bottle will be for Topiramate '200mg'$ : take 1 tablet twice a day  2. Keep a calendar of the seizures  3. Follow-up in 3-4 months, call for any changes   Seizure Precautions: 1. If medication has been prescribed for you to prevent seizures, take it exactly as directed.  Do not stop taking the medicine without talking to your doctor first, even if you have not had a seizure in a long time.   2. Avoid activities in which a seizure would cause danger to yourself or to others.  Don't operate dangerous machinery, swim alone, or climb in high or dangerous places, such as on ladders, roofs, or girders.  Do not drive unless your doctor says you may.  3. If you have any warning that you may have a seizure, lay down in a safe place where you can't hurt yourself.    4.  No driving for 6 months from last seizure, as per [facility].   Please refer to the following link on the [facility] website for more information: http://www.epilepsyfoundation.org/answerplace/Social/driving/drivingu.cfm   5.  Maintain good sleep hygiene. Avoid alcohol.  6.  Contact your doctor if you have any problems that may be related to the medicine you are taking.  7.  Call 911 and bring the patient back to the ED if:        A.  The seizure lasts longer than 5 minutes.       B.  The patient doesn't awaken shortly after the seizure  C.  The patient has new problems such as difficulty seeing, speaking or moving  D.  The patient was injured during the seizure  E.  The patient has a temperature over 102 F (39C)  F.  The patient vomited and now is having trouble breathing
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
## 2022-06-09 ENCOUNTER — Other Ambulatory Visit: Payer: Self-pay

## 2022-07-17 ENCOUNTER — Ambulatory Visit: Payer: Medicaid Other | Admitting: Internal Medicine

## 2022-08-10 ENCOUNTER — Other Ambulatory Visit: Payer: Self-pay

## 2022-09-04 ENCOUNTER — Encounter: Payer: Self-pay | Admitting: Neurology

## 2022-09-04 ENCOUNTER — Other Ambulatory Visit: Payer: Self-pay

## 2022-09-04 ENCOUNTER — Ambulatory Visit: Payer: Medicaid Other | Admitting: Neurology

## 2022-09-04 VITALS — BP 134/84 | HR 88 | Ht 60.0 in | Wt 198.0 lb

## 2022-09-04 DIAGNOSIS — G40009 Localization-related (focal) (partial) idiopathic epilepsy and epileptic syndromes with seizures of localized onset, not intractable, without status epilepticus: Secondary | ICD-10-CM

## 2022-09-04 MED ORDER — TOPIRAMATE 200 MG PO TABS
200.0000 mg | ORAL_TABLET | Freq: Two times a day (BID) | ORAL | 3 refills | Status: DC
  Filled 2022-09-04 – 2022-11-17 (×2): qty 180, 90d supply, fill #0

## 2022-09-04 NOTE — Patient Instructions (Addendum)
Always good to see you. Continue Topiramate 200mg : take 1 tablet twice a day. Good luck with getting out there and increasing social activities.  Follow-up in 4 months, call for any changes.    Seizure Precautions: 1. If medication has been prescribed for you to prevent seizures, take it exactly as directed.  Do not stop taking the medicine without talking to your doctor first, even if you have not had a seizure in a long time.   2. Avoid activities in which a seizure would cause danger to yourself or to others.  Don't operate dangerous machinery, swim alone, or climb in high or dangerous places, such as on ladders, roofs, or girders.  Do not drive unless your doctor says you may.  3. If you have any warning that you may have a seizure, lay down in a safe place where you can't hurt yourself.    4.  No driving for 6 months from last seizure, as per [facility].   Please refer to the following link on the Epilepsy Foundation of [employer]'s website for more information: http://www.epilepsyfoundation.org/answerplace/Social/driving/drivingu.cfm   5.  Maintain good sleep hygiene. Avoid alcohol.  6.  Contact your doctor if you have any problems that may be related to the medicine you are taking.  7.  Call 911 and bring the patient back to the ED if:        A.  The seizure lasts longer than 5 minutes.       B.  The patient doesn't awaken shortly after the seizure  C.  The patient has new problems such as difficulty seeing, speaking or moving  D.  The patient was injured during the seizure  E.  The patient has a temperature over 102 F (39C)  F.  The patient vomited and now is having trouble breathing
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
## 2022-09-08 ENCOUNTER — Other Ambulatory Visit: Payer: Self-pay

## 2022-09-08 ENCOUNTER — Emergency Department (HOSPITAL_COMMUNITY)
Admission: EM | Admit: 2022-09-08 | Discharge: 2022-09-09 | Disposition: A | Discharge status: Home / Self Care | Payer: Medicaid Other | Attending: Emergency Medicine | Admitting: Emergency Medicine

## 2022-09-08 DIAGNOSIS — R7309 Other abnormal glucose: Secondary | ICD-10-CM | POA: Insufficient documentation

## 2022-09-08 DIAGNOSIS — E876 Hypokalemia: Secondary | ICD-10-CM

## 2022-09-08 DIAGNOSIS — F19959 Other psychoactive substance use, unspecified with psychoactive substance-induced psychotic disorder, unspecified: Secondary | ICD-10-CM | POA: Diagnosis not present

## 2022-09-08 DIAGNOSIS — R4182 Altered mental status, unspecified: Secondary | ICD-10-CM | POA: Insufficient documentation

## 2022-09-08 MED ORDER — LACTATED RINGERS IV BOLUS
1000.0000 mL | Freq: Once | INTRAVENOUS | Status: AC
  Administered 2022-09-09: 1000 mL via INTRAVENOUS

## 2022-09-08 MED ORDER — LORAZEPAM 2 MG/ML IJ SOLN
2.0000 mg | INTRAMUSCULAR | Status: DC | PRN
  Administered 2022-09-09: 2 mg via INTRAMUSCULAR
  Filled 2022-09-08: qty 1

## 2022-09-08 MED ORDER — ZIPRASIDONE MESYLATE 20 MG IM SOLR
20.0000 mg | Freq: Once | INTRAMUSCULAR | Status: AC
  Administered 2022-09-09: 20 mg via INTRAMUSCULAR
  Filled 2022-09-08: qty 20

## 2022-09-08 NOTE — ED Triage Notes (Signed)
Pt arrives to ED reported to be found knocking on doors in neighborhood when PD was called. Pt became combative. Pt was given 5mg  of Versed, pt reported to have HR of 130's. Pt endorses 1 beer and one joint tonight

## 2022-09-08 NOTE — ED Provider Notes (Signed)
[place] EMERGENCY DEPARTMENT AT [facility] Provider Note   CSN: [id number] Arrival date & time: 09/08/22  2339     History {Add pertinent medical, surgical, social history, OB history to HPI:1} Chief Complaint  Patient presents with   Altered Mental Status    Marie Ferguson is a 57 y.o. female.  57 yo M w/ h/o seizures here with AMS.  Police were called to an apartment complex where she is acting erratically walk around knocking on doors trying prostatitis religion.  She would calm down but did become agitated and borderline manic again.  They called EMS.  She was initially calm with them but then got agitated and combative again requirin 5 mg of Versed, restraints.  Vitals reportedly mildly hypertensive and tachycardic.  Recently increased her Topamax but no other medication changes.  Drank some alcohol and smokes marijuana tonight but no other known drug use.   Altered Mental Status      Home Medications Prior to Admission medications   Medication Sig Start Date End Date Taking? Authorizing Provider  acetaminophen (TYLENOL) 325 MG tablet Take 650 mg by mouth every 6 (six) hours as needed.    [provider]  amLODipine (NORVASC) 10 MG tablet Take 1 tablet (10 mg total) by mouth daily. 05/28/22   Anders Simmonds, PA-C  atorvastatin (LIPITOR) 10 MG tablet Take 1 tablet (10 mg total) by mouth daily. 05/28/22   Anders Simmonds, PA-C  hydrochlorothiazide (HYDRODIURIL) 25 MG tablet Take 1 tablet (25 mg total) by mouth daily. 05/28/22   Anders Simmonds, PA-C  losartan (COZAAR) 25 MG tablet Take 1 tablet (25 mg total) by mouth daily. 05/28/22   Anders Simmonds, PA-C  OVER THE COUNTER MEDICATION Place 1 drop into both eyes daily as needed (irritation). Family care - lubricating eye drops.    [provider]  topiramate (TOPAMAX) 200 MG tablet Take 1 tablet (200 mg total) by mouth 2 (two) times daily. 09/04/22   Van Clines, MD      Allergies    Patient  has no known allergies.    Review of Systems   Review of Systems  Physical Exam Updated Vital Signs There were no vitals taken for this visit. Physical Exam Vitals and nursing note reviewed.  Constitutional:      Appearance: She is well-developed.  HENT:     Head: Normocephalic and atraumatic.     Mouth/Throat:     Mouth: Mucous membranes are dry.  Eyes:     Pupils: Pupils are equal, round, and reactive to light.  Cardiovascular:     Rate and Rhythm: Normal rate and regular rhythm.  Pulmonary:     Effort: No respiratory distress.     Breath sounds: No stridor.  Abdominal:     General: There is no distension.  Musculoskeletal:     Cervical back: Normal range of motion.  Neurological:     Mental Status: She is alert.     ED Results / Procedures / Treatments   Labs (all labs ordered are listed, but only abnormal results are displayed) Labs Reviewed  CBC WITH DIFFERENTIAL/PLATELET  COMPREHENSIVE METABOLIC PANEL  RAPID URINE DRUG SCREEN, HOSP PERFORMED  URINALYSIS, ROUTINE W REFLEX MICROSCOPIC  ETHANOL  BLOOD GAS, VENOUS  CBG MONITORING, ED    EKG None  Radiology No results found.  Procedures .Critical Care  Performed by: Marily Memos, MD Authorized by: Marily Memos, MD   Critical care provider statement:  Critical care time (minutes):  30   Critical care was necessary to treat or prevent imminent or life-threatening deterioration of the following conditions:  CNS failure or compromise (psychiatric vs neurologic)   Critical care was time spent personally by me on the following activities:  Development of treatment plan with patient or surrogate, discussions with consultants, evaluation of patient's response to treatment, examination of patient, ordering and review of laboratory studies, ordering and review of radiographic studies, ordering and performing treatments and interventions, pulse oximetry, re-evaluation of patient's condition and review of old  charts     Medications Ordered in ED Medications  ziprasidone (GEODON) injection 20 mg (has no administration in time range)  LORazepam (ATIVAN) injection 2 mg (has no administration in time range)    ED Course/ Medical Decision Making/ A&P   {   Click here for ABCD2, HEART and other calculatorsREFRESH Note before signing :1}                          Medical Decision Making Amount and/or Complexity of Data Reviewed Labs: ordered. Radiology: ordered. ECG/medicine tests: ordered.  Risk Prescription drug management.   EMS spoke with the daughter and patient has no psychiatric history.  This could be ingestion or any psychotic break although that would be unlikely 57 years old.  Will sedate and temporarily restrain for her safety and completion of workup.    {Document critical care time when appropriate:1} {Document review of labs and clinical decision tools ie heart score, Chads2Vasc2 etc:1}  {Document your independent review of radiology images, and any outside records:1} {Document your discussion with family members, caretakers, and with consultants:1} {Document social determinants of health affecting pt's care:1} {Document your decision making why or why not admission, treatments were needed:1} Final Clinical Impression(s) / ED Diagnoses Final diagnoses:  None    Rx / DC Orders ED Discharge Orders     None

## 2022-09-09 ENCOUNTER — Encounter (HOSPITAL_COMMUNITY): Payer: Self-pay

## 2022-09-09 ENCOUNTER — Emergency Department (HOSPITAL_COMMUNITY): Payer: Medicaid Other

## 2022-09-09 ENCOUNTER — Emergency Department (EMERGENCY_DEPARTMENT_HOSPITAL)
Admission: EM | Admit: 2022-09-09 | Discharge: 2022-09-11 | Disposition: A | Discharge status: Other Acute Inpatient Hospital | Payer: Medicaid Other | Source: Home / Self Care | Attending: Emergency Medicine | Admitting: Emergency Medicine

## 2022-09-09 DIAGNOSIS — E876 Hypokalemia: Secondary | ICD-10-CM

## 2022-09-09 DIAGNOSIS — F19959 Other psychoactive substance use, unspecified with psychoactive substance-induced psychotic disorder, unspecified: Secondary | ICD-10-CM | POA: Insufficient documentation

## 2022-09-09 DIAGNOSIS — F29 Unspecified psychosis not due to a substance or known physiological condition: Secondary | ICD-10-CM

## 2022-09-09 LAB — CBC WITH DIFFERENTIAL/PLATELET
Abs Immature Granulocytes: 0.03 10*3/uL (ref 0.00–0.07)
Basophils Absolute: 0 10*3/uL (ref 0.0–0.1)
Basophils Relative: 0 %
Eosinophils Absolute: 0 10*3/uL (ref 0.0–0.5)
Eosinophils Relative: 0 %
HCT: 36 % (ref 36.0–46.0)
Hemoglobin: 12.3 g/dL (ref 12.0–15.0)
Immature Granulocytes: 0 %
Lymphocytes Relative: 7 %
Lymphs Abs: 0.8 10*3/uL (ref 0.7–4.0)
MCH: 28.7 pg (ref 26.0–34.0)
MCHC: 34.2 g/dL (ref 30.0–36.0)
MCV: 83.9 fL (ref 80.0–100.0)
Monocytes Absolute: 0.9 10*3/uL (ref 0.1–1.0)
Monocytes Relative: 8 %
Neutro Abs: 9.5 10*3/uL — ABNORMAL HIGH (ref 1.7–7.7)
Neutrophils Relative %: 85 %
Platelets: 371 10*3/uL (ref 150–400)
RBC: 4.29 MIL/uL (ref 3.87–5.11)
RDW: 13.2 % (ref 11.5–15.5)
WBC: 11.2 10*3/uL — ABNORMAL HIGH (ref 4.0–10.5)
nRBC: 0 % (ref 0.0–0.2)

## 2022-09-09 LAB — URINALYSIS, ROUTINE W REFLEX MICROSCOPIC
Bilirubin Urine: NEGATIVE
Glucose, UA: NEGATIVE mg/dL
Hgb urine dipstick: NEGATIVE
Ketones, ur: NEGATIVE mg/dL
Leukocytes,Ua: NEGATIVE
Nitrite: NEGATIVE
Protein, ur: NEGATIVE mg/dL
Specific Gravity, Urine: 1.002 — ABNORMAL LOW (ref 1.005–1.030)
pH: 5 (ref 5.0–8.0)

## 2022-09-09 LAB — RAPID URINE DRUG SCREEN, HOSP PERFORMED
Amphetamines: NOT DETECTED
Barbiturates: NOT DETECTED
Benzodiazepines: POSITIVE — AB
Cocaine: NOT DETECTED
Opiates: NOT DETECTED
Tetrahydrocannabinol: POSITIVE — AB

## 2022-09-09 LAB — BASIC METABOLIC PANEL
Anion gap: 10 (ref 5–15)
BUN: 13 mg/dL (ref 6–20)
CO2: 21 mmol/L — ABNORMAL LOW (ref 22–32)
Calcium: 8.3 mg/dL — ABNORMAL LOW (ref 8.9–10.3)
Chloride: 102 mmol/L (ref 98–111)
Creatinine, Ser: 0.89 mg/dL (ref 0.44–1.00)
GFR, Estimated: >60 mL/min (ref >60)
Glucose, Bld: 95 mg/dL (ref 70–99)
Potassium: 3.6 mmol/L (ref 3.5–5.1)
Sodium: 133 mmol/L — ABNORMAL LOW (ref 135–145)

## 2022-09-09 LAB — LACTIC ACID, PLASMA: Lactic Acid, Venous: 1.3 mmol/L (ref 0.5–1.9)

## 2022-09-09 LAB — COMPREHENSIVE METABOLIC PANEL
ALT: 20 U/L (ref 0–44)
AST: 22 U/L (ref 15–41)
Albumin: 3.6 g/dL (ref 3.5–5.0)
Alkaline Phosphatase: 118 U/L (ref 38–126)
Anion gap: 14 (ref 5–15)
BUN: 18 mg/dL (ref 6–20)
CO2: 16 mmol/L — ABNORMAL LOW (ref 22–32)
Calcium: 8.9 mg/dL (ref 8.9–10.3)
Chloride: 99 mmol/L (ref 98–111)
Creatinine, Ser: 1.14 mg/dL — ABNORMAL HIGH (ref 0.44–1.00)
GFR, Estimated: 56 mL/min — ABNORMAL LOW (ref >60)
Glucose, Bld: 90 mg/dL (ref 70–99)
Potassium: 2.5 mmol/L — CL (ref 3.5–5.1)
Sodium: 129 mmol/L — ABNORMAL LOW (ref 135–145)
Total Bilirubin: 0.7 mg/dL (ref 0.3–1.2)
Total Protein: 7.8 g/dL (ref 6.5–8.1)

## 2022-09-09 LAB — I-STAT VENOUS BLOOD GAS, ED
Acid-base deficit: 6 mmol/L — ABNORMAL HIGH (ref 0.0–2.0)
Bicarbonate: 15.9 mmol/L — ABNORMAL LOW (ref 20.0–28.0)
Calcium, Ion: 1.07 mmol/L — ABNORMAL LOW (ref 1.15–1.40)
HCT: 36 % (ref 36.0–46.0)
Hemoglobin: 12.2 g/dL (ref 12.0–15.0)
O2 Saturation: 96 %
Potassium: 2.7 mmol/L — CL (ref 3.5–5.1)
Sodium: 133 mmol/L — ABNORMAL LOW (ref 135–145)
TCO2: 17 mmol/L — ABNORMAL LOW (ref 22–32)
pCO2, Ven: 22.8 mmHg — ABNORMAL LOW (ref 44–60)
pH, Ven: 7.451 — ABNORMAL HIGH (ref 7.25–7.43)
pO2, Ven: 73 mmHg — ABNORMAL HIGH (ref 32–45)

## 2022-09-09 LAB — ETHANOL: Alcohol, Ethyl (B): <10 mg/dL (ref <10)

## 2022-09-09 LAB — CBG MONITORING, ED: Glucose-Capillary: 90 mg/dL (ref 70–99)

## 2022-09-09 MED ORDER — TOPIRAMATE 25 MG PO TABS
200.0000 mg | ORAL_TABLET | Freq: Two times a day (BID) | ORAL | Status: DC
  Administered 2022-09-09 – 2022-09-10 (×3): 200 mg via ORAL
  Filled 2022-09-09 (×3): qty 8

## 2022-09-09 MED ORDER — HYDROCHLOROTHIAZIDE 12.5 MG PO TABS
12.5000 mg | ORAL_TABLET | Freq: Every day | ORAL | Status: DC
  Administered 2022-09-09: 12.5 mg via ORAL
  Filled 2022-09-09: qty 1

## 2022-09-09 MED ORDER — AMLODIPINE BESYLATE 5 MG PO TABS
10.0000 mg | ORAL_TABLET | Freq: Every day | ORAL | Status: DC
  Administered 2022-09-09: 10 mg via ORAL
  Filled 2022-09-09: qty 2

## 2022-09-09 MED ORDER — ATORVASTATIN CALCIUM 10 MG PO TABS
10.0000 mg | ORAL_TABLET | Freq: Every day | ORAL | Status: DC
  Administered 2022-09-10: 10 mg via ORAL
  Filled 2022-09-09: qty 1

## 2022-09-09 MED ORDER — ACETAMINOPHEN 325 MG PO TABS: 650.0000 mg | ORAL_TABLET | Freq: Four times a day (QID) | ORAL | Status: DC | PRN

## 2022-09-09 MED ORDER — POTASSIUM CHLORIDE 10 MEQ/100ML IV SOLN
10.0000 meq | INTRAVENOUS | Status: AC
  Administered 2022-09-09 (×3): 10 meq via INTRAVENOUS
  Filled 2022-09-09 (×3): qty 100

## 2022-09-09 MED ORDER — POTASSIUM CHLORIDE CRYS ER 20 MEQ PO TBCR
40.0000 meq | EXTENDED_RELEASE_TABLET | Freq: Once | ORAL | Status: AC
  Administered 2022-09-09: 40 meq via ORAL
  Filled 2022-09-09: qty 2

## 2022-09-09 MED ORDER — LACTATED RINGERS IV BOLUS
1000.0000 mL | Freq: Once | INTRAVENOUS | Status: AC
  Administered 2022-09-09: 1000 mL via INTRAVENOUS

## 2022-09-09 MED ORDER — AMLODIPINE BESYLATE 5 MG PO TABS: 10.0000 mg | ORAL_TABLET | Freq: Every day | ORAL | Status: DC

## 2022-09-09 MED ORDER — MAGNESIUM SULFATE 2 GM/50ML IV SOLN
2.0000 g | Freq: Once | INTRAVENOUS | Status: AC
  Administered 2022-09-09: 2 g via INTRAVENOUS
  Filled 2022-09-09: qty 50

## 2022-09-09 MED ORDER — POTASSIUM CHLORIDE CRYS ER 20 MEQ PO TBCR
20.0000 meq | EXTENDED_RELEASE_TABLET | Freq: Every day | ORAL | Status: DC
  Administered 2022-09-10: 20 meq via ORAL
  Filled 2022-09-09: qty 1

## 2022-09-09 MED ORDER — LOSARTAN POTASSIUM 50 MG PO TABS
25.0000 mg | ORAL_TABLET | Freq: Every day | ORAL | Status: DC
  Administered 2022-09-09 – 2022-09-10 (×2): 25 mg via ORAL
  Filled 2022-09-09 (×2): qty 1

## 2022-09-09 MED ORDER — LOSARTAN POTASSIUM 50 MG PO TABS: 25.0000 mg | ORAL_TABLET | Freq: Every day | ORAL | Status: DC

## 2022-09-09 MED ORDER — LOSARTAN POTASSIUM 50 MG PO TABS
25.0000 mg | ORAL_TABLET | Freq: Every day | ORAL | Status: DC
  Administered 2022-09-09: 25 mg via ORAL
  Filled 2022-09-09: qty 1

## 2022-09-09 NOTE — ED Notes (Signed)
Patient changed into hospital scrubs and Security Wand performed by [employer]

## 2022-09-09 NOTE — ED Triage Notes (Addendum)
Py brought back to ED by [employer] after eloping earlier this afternoon; Pt IVC'd by Dr. Renaye Rakers; pt alert, oriented, calm and cooperative at present; pt denies SI/HI, denies A/V hallucinations; pt endorses drinking one beer and smoking marijuana today

## 2022-09-09 NOTE — ED Notes (Signed)
Notified Dr. Lynelle Doctor that the patient did not receive 1515 ordered medications, Cozaar and Topamax, and she has Topamax scheduled for 2200. Dr. Lynelle Doctor ordered to administer Topamax at 2200 and not administer 1515 dose, Pharmacy to reschedule Cozaar for 2200

## 2022-09-09 NOTE — ED Notes (Signed)
IVC paperwork done,  IVC: 09/09/22  EXP5/15/24

## 2022-09-09 NOTE — ED Provider Notes (Addendum)
Patient returns to ED with security escort after elopement earlier.  IVC papers have been filed, as the patient is a potential danger to herself.  Daughter and son were notified by phone earlier and had reported intentions to come to ED.  Patient is medically cleared at this time for TTS evaluation.    Concern for potential drug-induced psychosis, new episode.  I am requesting repeat BMP (K) check tomorrow morning as she was initially quite hypokalemic on arrival, although improved to K 3.5 (normal) after IV potassium in the ED.  This should not preclude behavioral assessment.  We can provide oral K supplements.  We'll hold her HCTZ in the ED given this hypokalemia issue.   Terald Sleeper, MD 09/09/22 1432    Terald Sleeper, MD 09/09/22 [phone]

## 2022-09-09 NOTE — ED Provider Notes (Signed)
57 yo female here with concern for AMS/bizarre behavior Received sedation on arrival to hospital for extreme agitation Initially hypokalemic but repeat K wnl. Pt having some improving mental status this morning but still groggy Reports +THC use last night  Physical Exam  BP 130/78   Pulse 77   Temp 97.6 F (36.4 C) (Temporal)   Resp 18   SpO2 100%   Physical Exam  Procedures  Procedures  ED Course / MDM   Clinical Course as of 09/09/22 1335  Wed Sep 09, 2022  1258 Her daughter states patient is still in building according to recent phone call.  Security has been notified and is searching for patient.  IVC papers filled and left with secretary to file [MT]  1259 Patient answered phone and is still on hospital lot, outside currently - I asked her to stay put, security notified and searching for her. [MT]    Clinical Course User Index [MT] Marise Knapper, Kermit Balo, MD   Medical Decision Making Amount and/or Complexity of Data Reviewed Labs: ordered. Radiology: ordered. ECG/medicine tests: ordered.  Risk Prescription drug management.   I reevaluated the patientat 11 am this morning.  She had been awake, appeared to be much clearer in her speech and her thought pattern.  However she was still somewhat tangential in her speech.  No active visual auditory hallucinations.  She was able to provide appropriate information about her living circumstances, but cannot recall the exact details of the events last night.  I subsequently spoke to her daughter to obtain collateral information.  Her daughter reports that her mother has never had any confirmed psychotic episodes.  She has suspicions that her mother had been smoking marijuana for "some time".  Her daughter, [name], reports that the patient lives by herself, typically was able to take care of herself.  She did express concern about this erratic behavior last night which she says is atypical for her mother.  Subsequently, before I could  reassess the patient, I was informed that the patient had eloped from the ED with her IV still believed to be in place.  I've asked the charge nurse to contact the police department to search for the patient and return her to the hospital. Please see ed course.  IVC paperwork filed - concern for psychosis, possibly drug-induced   Terald Sleeper, MD 09/09/22 1335

## 2022-09-09 NOTE — Consult Note (Signed)
Received a message from Burman Nieves, RN that patient is asleep, has been asleep most of the day and has difficulty staying awake.  Pt to somnolent to participate in assessment.

## 2022-09-09 NOTE — ED Notes (Signed)
Marie Ferguson (son) [phone]. Would like an update

## 2022-09-09 NOTE — Consult Note (Signed)
Sent message to Jiles Prows, RN caring for patient.  Requesting to see patient for psych assessment via tts cart.  Nurse states she will get the cart set up.

## 2022-09-10 DIAGNOSIS — F19959 Other psychoactive substance use, unspecified with psychoactive substance-induced psychotic disorder, unspecified: Secondary | ICD-10-CM | POA: Insufficient documentation

## 2022-09-10 LAB — BASIC METABOLIC PANEL
Anion gap: 9 (ref 5–15)
BUN: 11 mg/dL (ref 6–20)
CO2: 23 mmol/L (ref 22–32)
Calcium: 8.8 mg/dL — ABNORMAL LOW (ref 8.9–10.3)
Chloride: 104 mmol/L (ref 98–111)
Creatinine, Ser: 0.9 mg/dL (ref 0.44–1.00)
GFR, Estimated: >60 mL/min (ref >60)
Glucose, Bld: 96 mg/dL (ref 70–99)
Potassium: 2.9 mmol/L — ABNORMAL LOW (ref 3.5–5.1)
Sodium: 136 mmol/L (ref 135–145)

## 2022-09-10 MED ORDER — POTASSIUM CHLORIDE 20 MEQ PO PACK
60.0000 meq | PACK | Freq: Once | ORAL | Status: AC
  Administered 2022-09-10: 60 meq via ORAL
  Filled 2022-09-10: qty 3

## 2022-09-10 MED ORDER — LORAZEPAM 2 MG/ML IJ SOLN: 1.0000 mg | INTRAMUSCULAR | Status: DC | PRN

## 2022-09-10 MED ORDER — OLANZAPINE 2.5 MG PO TABS
2.5000 mg | ORAL_TABLET | Freq: Every day | ORAL | Status: DC
  Administered 2022-09-10: 2.5 mg via ORAL
  Filled 2022-09-10 (×2): qty 1

## 2022-09-10 NOTE — ED Provider Notes (Signed)
Emergency Medicine Observation Re-evaluation Note  Marie Ferguson is a 57 y.o. female, seen on rounds today.  Pt initially presented to the ED for complaints of IVC Currently, the patient is eating breakfast. Reports "I feel excellent!".  Physical Exam  BP 103/61 (BP Location: Right Arm)   Pulse 80   Temp 98.7 F (37.1 C) (Oral)   Resp 17   SpO2 94%  Physical Exam General: No distress Cardiac: regular rate Lungs: equal chest rise Psych: pressured speech, cooperative  ED Course / MDM  EKG:   I have reviewed the labs performed to date as well as medications administered while in observation.  Recent changes in the last 24 hours include eloped from ER but returned. Plan to check repeat BMP this AM given hypokalemia.  Plan  Current plan is for TTS re-assessment in AM. Re-check BMP.Lonell Grandchild, MD 09/10/22 [phone]

## 2022-09-10 NOTE — Consult Note (Signed)
BH ED ASSESSMENT   Reason for Consult:  Erratic  Behavior Referring Physician:  Alvester Chou MD Patient Identification: Marie Ferguson MRN:  [id number] ED Chief Complaint: <principal problem not specified>  Diagnosis:  Active Problems:   Brief psychotic disorder (HCC)    HPI:    Per IVC " Erratic behavior, religious delusions, pressured speech, family concerned about mental health crisis "  Marie Ferguson, 57 y.o., female patient seen face to face by this provider, consulted with Dr. Lucianne Muss and chart reviewed on 09/10/22.  Per chart review patient does not have a psychiatric history. She did elope from the ED on 09/09/22 after being brought in for AMS/bizarre behavior and has since been located and returned to ED. On evaluation Marie Ferguson reports Tuesday evening 09/08/22 she was in the shower and felt like it was loud outside of her apartment, she felt as though she was having an out of body experience, and she thought the Lord told her the world was going to end. She says she was told she was knocking on neighbors doors telling them that Jesus was coming but she does not recall knocking on anyone's door. [name] endorses Marijuana use.  During evaluation [name] is seated on stretcher in room and in no acute distress. She is alert, oriented x 4, tearful, cooperative and attentive. Her mood is euthymic with congruent affect, she is restless on exam and reports increased energy this morning. She has normal speech and frequently says "Hallelujah" during conversation when discussing recent stressors. She reports she feels she has been approaching a breaking point because of financial challenges. She is financially dependent upon her daughter and feels shame for "not even being able to afford toilet paper to wipe my ass, that will make anyone snap."  Marie Ferguson still reports continuing to have feelings of out of body experiences and that Jesus told her he is imminently coming. Thought process is linear with  hyper-religiousity. She also denies suicidal/self-harm/homicidal ideation, she denies auditory or visual hallucinations, or paranoia at this time.    Collateral obtained from [place] daughter [phone] with patients consent. Per daughter her mother has a history of seizures and she is unsure if her mental episodes are associated with this. She reports she normally talks to her mom daily but did not hear from her Sunday or Monday morning. Later Monday daughter reports M(r)s [name] was calling her and her brother throughout the day laughing, overly excited, depressed with up and down mood. Tuesday patient called daughter and said "well I'm trying to make sure nobody is in the house with me." Daughter reports the patient is still talking about an out of body experience/revelation she had Tuesday morning where the world is ending when she talked with her this morning. She says her mom did tell her she smoked Marijuana with a neighbor but did not know when or if she smoked with the neighbor prior to the odd behaviors beginning on Monday. She is also concerned her mother has not been eating well.   Past Psychiatric History: Patient denies, none in chart   Risk to Self or Others: Is the patient at risk to self? No Has the patient been a risk to self in the past 6 months? No Has the patient been a risk to self within the distant past? No Is the patient a risk to others? No Has the patient been a risk to others in the past 6 months? No Has the patient been a risk to others within the  distant past? No  [place] Scale:  Flowsheet Row ED from 09/09/2022 in [facility] Emergency Department at [facility] ED from 09/08/2022 in [facility] Emergency Department at [facility] ED from 07/31/2021 in [facility] Emergency Department at [facility]  C-SSRS RISK CATEGORY No Risk No Risk No Risk       AIMS:  , , ,  ,   ASAM: ASAM Multidimensional Assessment Summary DImension 1:  Acute  Intoxication and/or Withdrawal Potential Severity Rating: None Dimension 2:  Biomedical Conditions and Complications Severity Rating: None Dimension 3:  Emotional, behavioral or cognitive (EBC) conditions and complications severity rating: None Dimension 4:  Readiness to Change Severity Rating: None Dimension 5:  Relapse, continued use, or continued problem potential severity rating: None Dimension 6:  Recovery/living environment severity rating: None ASAM Recommended Level of Treatment:  (n/a)  Substance Abuse:  Alcohol / Drug Use Pain Medications: See MAR Prescriptions: See MAR Over the Counter: See MAR History of alcohol / drug use?: Yes Longest period of sobriety (when/how long): UTA Negative Consequences of Use: Personal relationships Withdrawal Symptoms: None  Past Medical History:  Past Medical History:  Diagnosis Date   Hypertension    Sudden loss of vision, right    burst blood vessel    Past Surgical History:  Procedure Laterality Date   ABDOMINAL HYSTERECTOMY     BREAST LUMPECTOMY WITH RADIOACTIVE SEED LOCALIZATION Left 03/03/2019   Procedure: LEFT BREAST LUMPECTOMY X 2 WITH 2 RADIOACTIVE SEED LOCALIZATION;  Surgeon: Griselda Miner, MD;  Location: [facility];  Service: General;  Laterality: Left;   CESAREAN SECTION     x 3   COLONOSCOPY  07/21/2019   POLYPECTOMY  07/21/2019   Family History:  Family History  Problem Relation Age of Onset   Hypertension Mother    Hypertension Father    Heart attack Brother    Diabetes Maternal Aunt        x2   Diabetes Maternal Uncle    Family Psychiatric  History: patient denies Social History:  Social History   Substance and Sexual Activity  Alcohol Use Yes   Comment: rarely     Social History   Substance and Sexual Activity  Drug Use Yes   Types: Marijuana   Comment: 1x a week    Social History   Socioeconomic History   Marital status: Single    Spouse name: Not on file   Number of children:  3   Years of education: 1 yr community college   Highest education level: Some college, no degree  Occupational History   Occupation: Occupational hygienist  Tobacco Use   Smoking status: Every Day    Packs/day: .25    Types: Cigarettes   Smokeless tobacco: Never   Tobacco comments:    5 cigs/ day  Vaping Use   Vaping Use: Never used  Substance and Sexual Activity   Alcohol use: Yes    Comment: rarely   Drug use: Yes    Types: Marijuana    Comment: 1x a week   Sexual activity: Not Currently  Other Topics Concern   Not on file  Social History Narrative   Right handed    [place]    Social Determinants of Health   Financial Resource Strain: Not on file  Food Insecurity: Not on file  Transportation Needs: Unmet Transportation Needs (12/29/2018)   PRAPARE - Administrator, Civil Service (Medical): Yes  Lack of Transportation (Non-Medical): Yes  Physical Activity: Not on file  Stress: Not on file  Social Connections: Not on file   Additional Social History: N/A    Allergies:  No Known Allergies  Labs:  Results for orders placed or performed during the hospital encounter of 09/09/22 (from the past 48 hour(s))  Basic metabolic panel     Status: Abnormal   Collection Time: 09/10/22  8:55 AM  Result Value Ref Range   Sodium 136 135 - 145 mmol/L   Potassium 2.9 (L) 3.5 - 5.1 mmol/L   Chloride 104 98 - 111 mmol/L   CO2 23 22 - 32 mmol/L   Glucose, Bld 96 70 - 99 mg/dL    Comment: Glucose reference range applies only to samples taken after fasting for at least 8 hours.   BUN 11 6 - 20 mg/dL   Creatinine, Ser 1.61 0.44 - 1.00 mg/dL   Calcium 8.8 (L) 8.9 - 10.3 mg/dL   GFR, Estimated >09 >60 mL/min    Comment: (NOTE) Calculated using the CKD-EPI Creatinine Equation (2021)    Anion gap 9 5 - 15    Comment: Performed at [facility], [address] [place], [place] [address]    Current Facility-Administered Medications  Medication Dose  Route Frequency Provider Last Rate Last Admin   acetaminophen (TYLENOL) tablet 650 mg  650 mg Oral Q6H PRN Terald Sleeper, MD       amLODipine (NORVASC) tablet 10 mg  10 mg Oral Daily Trifan, Kermit Balo, MD       atorvastatin (LIPITOR) tablet 10 mg  10 mg Oral Daily Terald Sleeper, MD   10 mg at 09/10/22 1016   LORazepam (ATIVAN) injection 1 mg  1 mg Intravenous Q4H PRN Ehab Humber L, NP       losartan (COZAAR) tablet 25 mg  25 mg Oral QHS Terald Sleeper, MD   25 mg at 09/09/22 2213   OLANZapine (ZYPREXA) tablet 2.5 mg  2.5 mg Oral QHS Soren Lazarz L, NP       potassium chloride SA (KLOR-CON M) CR tablet 20 mEq  20 mEq Oral Daily Terald Sleeper, MD   20 mEq at 09/10/22 1015   topiramate (TOPAMAX) tablet 200 mg  200 mg Oral BID Terald Sleeper, MD   200 mg at 09/10/22 1016   Current Outpatient Medications  Medication Sig Dispense Refill   acetaminophen (TYLENOL) 325 MG tablet Take 650 mg by mouth every 6 (six) hours as needed.     amLODipine (NORVASC) 10 MG tablet Take 1 tablet (10 mg total) by mouth daily. 90 tablet 1   atorvastatin (LIPITOR) 10 MG tablet Take 1 tablet (10 mg total) by mouth daily. 90 tablet 1   hydrochlorothiazide (HYDRODIURIL) 25 MG tablet Take 1 tablet (25 mg total) by mouth daily. 90 tablet 1   losartan (COZAAR) 25 MG tablet Take 1 tablet (25 mg total) by mouth daily. 90 tablet 1   topiramate (TOPAMAX) 200 MG tablet Take 1 tablet (200 mg total) by mouth 2 (two) times daily. 180 tablet 3   OVER THE COUNTER MEDICATION Place 1 drop into both eyes daily as needed (irritation). Family care - lubricating eye drops.      Musculoskeletal: Strength & Muscle Tone: within normal limits Gait & Station: normal Patient leans: N/A   Psychiatric Specialty Exam: Presentation  General Appearance:  Appropriate for Environment; Casual  Eye Contact: Good  Speech: Clear and Coherent; Normal  Rate  Speech Volume: Normal  Handedness: Right   Mood and Affect   Mood: Euthymic  Affect: Tearful   Thought Process  Thought Processes: Coherent  Descriptions of Associations:Intact  Orientation:Full (Time, Place and Person)  Thought Content:Linear with hyper religious content  History of Schizophrenia/Schizoaffective disorder:No  Duration of Psychotic Symptoms:No data recorded Hallucinations:Hallucinations: None  Ideas of Reference:No data recorded Suicidal Thoughts:Suicidal Thoughts: No  Homicidal Thoughts:Homicidal Thoughts: No   Sensorium  Memory: Immediate Good  Judgment: Fair  Insight: Good; Fair   Executive Functions  Concentration: Good  Attention Span: Good  Recall: Good  Fund of Knowledge: Good  Language: Good   Psychomotor Activity  Psychomotor Activity: Psychomotor Activity: Restlessness   Assets  Assets: Communication Skills; Housing; Desire for Improvement; Social Support    Sleep  Sleep: Sleep: Fair Number of Hours of Sleep: 5   Physical Exam: Physical Exam HENT:     Head: Normocephalic.  Cardiovascular:     Rate and Rhythm: Normal rate.  Pulmonary:     Effort: Pulmonary effort is normal.  Skin:    General: Skin is dry.  Neurological:     General: No focal deficit present.     Mental Status: She is alert and oriented to person, place, and time.    Review of Systems  Constitutional:  Negative for chills, fever and malaise/fatigue.  HENT:  Negative for congestion.   Respiratory:  Negative for cough and shortness of breath.   Cardiovascular:  Negative for palpitations.  Gastrointestinal:  Negative for abdominal pain, nausea and vomiting.  Musculoskeletal:  Negative for myalgias.  Psychiatric/Behavioral:  Positive for substance abuse. The patient is nervous/anxious.    Blood pressure 103/61, pulse 80, temperature 98.7 F (37.1 C), temperature source Oral, resp. rate 17, SpO2 94 %. There is no height or weight on file to calculate BMI.  Medical Decision  Making:  Patient case review and discussed with Dr. Lucianne Muss, patient meets criteria for psychiatric inpatient treatment. Patient requires inpatient treatment for acute crisis management and psychiatric stabilization. LCSW will fax out.    Problem 1: Substance Induced Psychosis  Zyprexa 2.5 mg BID   Disposition: Recommend psychiatric Inpatient admission when medically cleared.  Lajoyce Corners, NP 09/10/2022 4:11 PM

## 2022-09-10 NOTE — ED Notes (Signed)
Pt's son and daughter update on pt status. Pt on phone with family at this time.

## 2022-09-10 NOTE — BH Assessment (Signed)
Comprehensive Clinical Assessment (CCA) Note    09/10/2022 Marie Ferguson [id number]  Disposition: Sindy Guadeloupe, NP recommends continuous observation and patient reassessed during the day shift.    The patient demonstrates the following risk factors for suicide: Chronic risk factors for suicide include: N/A. Acute risk factors for suicide include: unemployment. Protective factors for this patient include: positive social support. Considering these factors, the overall suicide risk at this point appears to be low. Patient is appropriate for outpatient follow up.    Pt presents back to [facility] under an IVC after eloping from the ED. Pt was initially brought to the ED for altered mental status. Pt displayed bizarre behavior by knocking on several neighbors door to warm them about Jesus returning. Pt reports that she was unsure if it was a vision she saw that day or if she was dreaming before she started warning her neighbors. Pt reports that she has never bothered her neighbors. Pt did admit to smoking marijuana earlier that day. Pt does not have a hx of MH diagnosis. Pt has no hx of IP hospitalization. Pt reports that she currently not on any medications nor seeing a therapist at this time. Pt denies SI,HI, and AVH.    Pt was casually dressed and groomed appropriately. Pt was alert and cooperative. Pt is dressed unremarkably, alert, oriented x4 with normal speech and normal motor behavior. Eye contact is good. Pt's mood and affect is anxious.  Thought process is coherent and relevant. Pt's insight is fair and judgement is poor. There is no indication Pt is not currently responding to internal stimuli or experiencing delusional thought content. Pt was cooperative throughout assessment.  Chief Complaint:  Chief Complaint  Patient presents with   IVC   Visit Diagnosis:  Psychosis, Unspecified    CCA Screening, Triage and Referral (STR)  Patient Reported Information How did you hear about [place]? --  ([facility])  What Is the Reason for Your Visit/Call Today? Pt presents back to [facility] under an IVC after eloping from the ED. Pt was initially brought to the ED for altered mental status. Pt displayed bizarre behavior by knocking on several neighbors door to warm them about Jesus returning. Pt reports that she was unsure if it was a vision she saw that day or if she was dreaming before she started warning her neighbors. Pt reports that she has never bothered her neighbors. Pt did admit to smoking marijuana earlier that day. Pt does not have a hx of MH diagnosis. Pt has no hx of IP hospitalization. Pt reports that she currently not on any medications nor seeing a therapist at this time. Pt denies SI,HI, and AVH.  How Long Has This Been Causing You Problems? <Week  What Do You Feel Would Help You the Most Today? Medication(s)   Have You Recently Had Any Thoughts About Hurting Yourself? No  Are You Planning to Commit Suicide/Harm Yourself At This time? No   Flowsheet Row ED from 09[facility]09[facility]20[facility] ED from 09/08/2022 in [facility] Emergency Department at [facility] ED from 07/31/2021 in [facility] Emergency Department at [facility]  C-SSRS RISK CATEGORY No Risk No Risk No Risk         Have you Recently Had Thoughts About Hurting Someone [name]? No  Are You Planning to Harm Someone at This Time? No  Explanation: Pt denies HI/   Have You Used Any Alcohol or Drugs in the Past 24 Hours? Yes  What Did You Use and How Much? Pt reports smoking an unknown amount of Marijuana on 09/08/22.   Do You Currently Have a Therapist/Psychiatrist? No  Name of Therapist/Psychiatrist: Name of Therapist/Psychiatrist: Pt denies having outpatient services.   Have You Been Recently Discharged From Any Office Practice or Programs? No  Explanation of Discharge From Practice/Program: n/a     CCA Screening Triage Referral Assessment Type of Contact:  Tele-Assessment  Telemedicine Service Delivery: Telemedicine service delivery: This service was provided via telemedicine using a 2-way, interactive audio and video technology  Is this Initial or Reassessment? Is this Initial or Reassessment?: Initial Assessment  Date Telepsych consult ordered in [facility]:  Date Telepsych consult ordered in [facility]: 09/10/22  Time Telepsych consult ordered in [facility]:  Time Telepsych consult ordered in [facility]: 1459  Location of Assessment: [facility] ED  Provider Location: [facility] Assessment Services   Collateral Involvement: None   Does Patient Have a Automotive engineer Guardian? No  Legal Guardian Contact Information: n/a  Copy of Legal Guardianship Form: -- (n/a)  Legal Guardian Notified of Arrival: -- (n/a)  Legal Guardian Notified of Pending Discharge: -- (n/a)  If Minor and Not Living with Parent(s), Who has Custody? n/a  Is CPS involved or ever been involved? Never  Is APS involved or ever been involved? Never   Patient Determined To Be At Risk for Harm To Self or Others Based on Review of Patient Reported Information or Presenting Complaint? No  Method: No Plan  Availability of Means: n/a Intent: Vague intent or NA  Notification Required: No need or identified person  Additional Information for Danger to Others Potential: -- (n/a)  Additional Comments for Danger to Others Potential: n/a  Are There Guns or Other Weapons in Your Home? No  Types of Guns/Weapons: Pt denies access to any weapons/ guns.  Are These Weapons Safely Secured?                            Yes  Who Could Verify You Are Able To Have These Secured: Pt denies access to any weapons/ guns.  Do You Have any Outstanding Charges, Pending Court Dates, Parole/Probation? Pt denies any legal concerns.  Contacted To Inform of Risk of Harm To Self or Others: -- (n/a)    Does Patient Present under Involuntary Commitment? Yes    [place] of Residence: [place]   Patient  Currently Receiving the Following Services: Not Receiving Services   Determination of Need: Urgent (48 hours)   Options For Referral: Medication Management     CCA Biopsychosocial Patient Reported Schizophrenia/Schizoaffective Diagnosis in Past: No   Strengths: UTA   Mental Health Symptoms Depression:   None   Duration of Depressive symptoms:    Mania:   Increased Energy   Anxiety:    None   Psychosis:   None   Duration of Psychotic symptoms:    Trauma:   None   Obsessions:   None   Compulsions:   None   Inattention:   None   Hyperactivity/Impulsivity:   None   Oppositional/Defiant Behaviors:   None   Emotional Irregularity:   None   Other Mood/Personality Symptoms:   NA    Mental Status Exam Appearance and self-care  Stature:   Average   Weight:   Average weight   Clothing:   Casual   Grooming:   Normal   Cosmetic use:   None   Posture/gait:   Normal  Motor activity:   Not Remarkable   Sensorium  Attention:   Normal   Concentration:   Normal   Orientation:   Time; Situation; Place; Person   Recall/memory:   Normal   Affect and Mood  Affect:   Anxious   Mood:   Euphoric   Relating  Eye contact:   Normal   Facial expression:   Anxious   Attitude toward examiner:   Cooperative   Thought and Language  Speech flow:  Clear and Coherent   Thought content:   Appropriate to Mood and Circumstances   Preoccupation:   None   Hallucinations:   None   Organization:   Coherent   [employer] of Knowledge:   Fair   Intelligence:   Average   Abstraction:   Normal   Judgement:   Fair   Dance movement psychotherapist:   Realistic   Insight:   Fair   Decision Making:   Normal   Social Functioning  Social Maturity:   Impulsive   Social Judgement:   Normal   Stress  Stressors:   Other (Comment) (Pt denies any stressors at this time)   Coping Ability:   Exhausted; Overwhelmed    Skill Deficits:   Self-control   Supports:   Family     Religion: Religion/Spirituality Are You A Religious Person?: Yes What is Your Religious Affiliation?: Christian How Might This Affect Treatment?: NA  Leisure/Recreation: Leisure / Recreation Do You Have Hobbies?: Yes Leisure and Hobbies: play with grandchildren  Exercise/Diet: Exercise/Diet Do You Exercise?: No Have You Gained or Lost A Significant Amount of Weight in the Past Six Months?: No Do You Follow a Special Diet?: No Do You Have Any Trouble Sleeping?: No   CCA Employment/Education Employment/Work Situation: Employment / Work Situation Employment Situation: Unemployed Patient's Job has Been Impacted by Current Illness: No Has Patient ever Been in Equities trader?: No  Education: Education Is Patient Currently Attending School?: No Last Grade Completed: 12 Did You Product manager?: No Did You Have An Individualized Education Program (IIEP): No Did You Have Any Difficulty At [employer]?: No Patient's Education Has Been Impacted by Current Illness: No   CCA Family/Childhood History Family and Relationship History: Family history Marital status: Single Does patient have children?: Yes How many children?: 3 How is patient's relationship with their children?: Pt reports a good relationship  Childhood History:  Childhood History By whom was/is the patient raised?: Mother Did patient suffer any verbal/emotional/physical/sexual abuse as a child?: No Did patient suffer from severe childhood neglect?: No Has patient ever been sexually abused/assaulted/raped as an adolescent or adult?: No Was the patient ever a victim of a crime or a disaster?: No Witnessed domestic violence?: No Has patient been affected by domestic violence as an adult?: No       CCA Substance Use Alcohol/Drug Use: Alcohol / Drug Use Pain Medications: See MAR Prescriptions: See MAR Over the Counter: See MAR History of alcohol /  drug use?: Yes Longest period of sobriety (when/how long): UTA Negative Consequences of Use: Personal relationships Withdrawal Symptoms: None Substance #1 Name of Substance 1: Marijuana 1 - Age of First Use: unknown 1 - Amount (size/oz): a gram 1 - Frequency: twice a month 1 - Duration: NA 1 - Last Use / Amount: 5/7/20274 1 - Method of Aquiring: n/a 1- Route of Use: n/a                       ASAM's:  Six Dimensions of Multidimensional Assessment  Dimension 1:  Acute Intoxication and/or Withdrawal Potential:      Dimension 2:  Biomedical Conditions and Complications:      Dimension 3:  Emotional, Behavioral, or Cognitive Conditions and Complications:     Dimension 4:  Readiness to Change:     Dimension 5:  Relapse, Continued use, or Continued Problem Potential:     Dimension 6:  Recovery/Living Environment:     ASAM Severity Score:    ASAM Recommended Level of Treatment: ASAM Recommended Level of Treatment:  (n/a)   Substance use Disorder (SUD) Substance Use Disorder (SUD)  Checklist Symptoms of Substance Use:  (n/a)  Recommendations for Services/Supports/Treatments: Recommendations for Services/Supports/Treatments Recommendations For Services/Supports/Treatments:  (n/a)  Discharge Disposition:    DSM5 Diagnoses: Patient Active Problem List   Diagnosis Date Noted   Seizure disorder (HCC) 08/11/2021   Seizure-like activity (HCC) 02/12/2021   Prediabetes 07/30/2019   Intraductal papilloma of left breast 06/22/2019   Screening breast examination 12/29/2018   Breast pain, left 12/29/2018   Hyperlipidemia, mixed 12/08/2018   Hypertensive retinopathy of both eyes 11/07/2018   Essential hypertension 11/07/2018   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

## 2022-09-10 NOTE — Progress Notes (Signed)
LCSW Progress Note  [id number]   Marie Ferguson  09/10/2022  6:33 PM    Inpatient Behavioral Health Placement  Pt meets inpatient criteria per Lajoyce Corners, NP. There are no available beds within [facility] system per [facility] Rosey Bath, RN. Referral was sent to the following facilities;   Destination  Service Provider Address Phone Fax  [facility]  [address], [place] [address] [phone]  [facility] [address]  [address] [place] [address] [phone]  [facility]  [address] [place] [address] [phone]  [facility]  [address] [place] [address] [phone]  [facility]  [address] [place] [address] [phone]  [facility]  [address], [place] [address] [phone]  [facility]  [address], [place] [address] [phone]  [facility]  [address] [place] [address] [phone]  [facility]  [address], [place] [address] [phone]  [facility]  [address] [place] [address] [phone]  [facility]  [address] [place] [address] [phone]  [facility]  [address] [place] [address] [phone]  [facility]  [address], [place] [address] [phone]  [facility]  [address] [place] [address] [phone]  [facility]  [address] [place] [address] [phone]  [facility]  [address], [place] [address] [phone]  [facility]  [address] [place] [address] [phone]  [facility]  [address] [place], [place] [address] [phone]  [facility]  [address], [place] [address] [phone]  [facility]  [address] [place], [place] [address] [phone]  [facility]  [address], [place] [address] [phone]    Situation ongoing,  CSW will follow up.    Maryjean Ka, MSW, [facility] 09/10/2022 6:33 PM

## 2022-09-10 NOTE — Progress Notes (Signed)
Pt was accepted to [facility] TOMORROW 09/11/2022;Bed Assignment [facility] Building [address]  Pt meets inpatient criteria per Lajoyce Corners, NP   Attending Physician will be Daine Floras, MD  Report can be called to: [phone]  Pt can arrive after: 8:00am  Care Team notified:Katy Elder Love, NP,CSW Dispositions Asencion Islam, Dalia Heading, RN. Fenton Malling, RN   Kelton Pillar, LCSWA 09/10/2022 @ 7:17 PM

## 2022-09-11 NOTE — ED Notes (Signed)
IVC paperwork in blue zone 

## 2022-09-14 ENCOUNTER — Other Ambulatory Visit: Payer: Self-pay

## 2022-09-14 ENCOUNTER — Telehealth: Payer: Self-pay | Admitting: Neurology

## 2022-09-14 MED ORDER — TOPIRAMATE 200 MG PO TABS: 200.0000 mg | ORAL_TABLET | Freq: Two times a day (BID) | ORAL | 0 refills | Status: DC

## 2022-09-14 MED ORDER — AMLODIPINE BESYLATE 10 MG PO TABS
10.0000 mg | ORAL_TABLET | Freq: Every day | ORAL | 0 refills | Status: DC
  Filled 2022-09-14: qty 15, 15d supply, fill #0

## 2022-09-14 MED ORDER — ATORVASTATIN CALCIUM 10 MG PO TABS
10.0000 mg | ORAL_TABLET | Freq: Every day | ORAL | 0 refills | Status: DC
  Filled 2022-09-14: qty 15, 15d supply, fill #0

## 2022-09-14 MED ORDER — LOSARTAN POTASSIUM 25 MG PO TABS
25.0000 mg | ORAL_TABLET | Freq: Every day | ORAL | 0 refills | Status: DC
  Filled 2022-09-14: qty 15, 15d supply, fill #0

## 2022-09-14 NOTE — Telephone Encounter (Signed)
[facility] called in to alert our office that the patient was going to be discharging from there after having a seizure and having an altered mental state. She had been in the ED. They are going to fax [place] updated records.

## 2022-10-05 ENCOUNTER — Other Ambulatory Visit: Payer: Self-pay

## 2022-10-31 ENCOUNTER — Other Ambulatory Visit: Payer: Self-pay

## 2022-10-31 ENCOUNTER — Emergency Department (HOSPITAL_COMMUNITY)
Admission: EM | Admit: 2022-10-31 | Discharge: 2022-11-01 | Disposition: A | Discharge status: Home / Self Care | Payer: Medicaid Other | Attending: Emergency Medicine | Admitting: Emergency Medicine

## 2022-10-31 DIAGNOSIS — F29 Unspecified psychosis not due to a substance or known physiological condition: Secondary | ICD-10-CM | POA: Diagnosis not present

## 2022-10-31 DIAGNOSIS — E876 Hypokalemia: Secondary | ICD-10-CM | POA: Insufficient documentation

## 2022-10-31 DIAGNOSIS — R410 Disorientation, unspecified: Secondary | ICD-10-CM | POA: Diagnosis present

## 2022-10-31 LAB — COMPREHENSIVE METABOLIC PANEL
ALT: 11 U/L (ref 0–44)
AST: 12 U/L — ABNORMAL LOW (ref 15–41)
Albumin: 3.8 g/dL (ref 3.5–5.0)
Alkaline Phosphatase: 126 U/L (ref 38–126)
Anion gap: 17 — ABNORMAL HIGH (ref 5–15)
BUN: 15 mg/dL (ref 6–20)
CO2: 16 mmol/L — ABNORMAL LOW (ref 22–32)
Calcium: 8.9 mg/dL (ref 8.9–10.3)
Chloride: 97 mmol/L — ABNORMAL LOW (ref 98–111)
Creatinine, Ser: 0.72 mg/dL (ref 0.44–1.00)
GFR, Estimated: >60 mL/min (ref >60)
Glucose, Bld: 99 mg/dL (ref 70–99)
Potassium: 2.5 mmol/L — CL (ref 3.5–5.1)
Sodium: 130 mmol/L — ABNORMAL LOW (ref 135–145)
Total Bilirubin: 0.4 mg/dL (ref 0.3–1.2)
Total Protein: 8.5 g/dL — ABNORMAL HIGH (ref 6.5–8.1)

## 2022-10-31 LAB — RAPID URINE DRUG SCREEN, HOSP PERFORMED
Amphetamines: NOT DETECTED
Barbiturates: NOT DETECTED
Benzodiazepines: NOT DETECTED
Cocaine: NOT DETECTED
Opiates: NOT DETECTED
Tetrahydrocannabinol: POSITIVE — AB

## 2022-10-31 LAB — CBC
HCT: 36.3 % (ref 36.0–46.0)
Hemoglobin: 12.2 g/dL (ref 12.0–15.0)
MCH: 28.1 pg (ref 26.0–34.0)
MCHC: 33.6 g/dL (ref 30.0–36.0)
MCV: 83.6 fL (ref 80.0–100.0)
Platelets: 414 10*3/uL — ABNORMAL HIGH (ref 150–400)
RBC: 4.34 MIL/uL (ref 3.87–5.11)
RDW: 13.2 % (ref 11.5–15.5)
WBC: 7.7 10*3/uL (ref 4.0–10.5)
nRBC: 0 % (ref 0.0–0.2)

## 2022-10-31 LAB — MAGNESIUM: Magnesium: 2.3 mg/dL (ref 1.7–2.4)

## 2022-10-31 LAB — ETHANOL: Alcohol, Ethyl (B): 32 mg/dL — ABNORMAL HIGH (ref <10)

## 2022-10-31 LAB — SALICYLATE LEVEL: Salicylate Lvl: <7 mg/dL — ABNORMAL LOW (ref 7.0–30.0)

## 2022-10-31 LAB — ACETAMINOPHEN LEVEL: Acetaminophen (Tylenol), Serum: <10 ug/mL — ABNORMAL LOW (ref 10–30)

## 2022-10-31 MED ORDER — POTASSIUM CHLORIDE 10 MEQ/100ML IV SOLN
10.0000 meq | INTRAVENOUS | Status: AC
  Administered 2022-10-31 (×2): 10 meq via INTRAVENOUS
  Filled 2022-10-31 (×2): qty 100

## 2022-10-31 MED ORDER — OLANZAPINE 5 MG PO TBDP
5.0000 mg | ORAL_TABLET | Freq: Once | ORAL | Status: AC
  Administered 2022-10-31: 5 mg via ORAL
  Filled 2022-10-31: qty 1

## 2022-10-31 MED ORDER — ACETAMINOPHEN 325 MG PO TABS
650.0000 mg | ORAL_TABLET | Freq: Once | ORAL | Status: AC
  Administered 2022-10-31: 650 mg via ORAL
  Filled 2022-10-31: qty 2

## 2022-10-31 MED ORDER — POTASSIUM CHLORIDE CRYS ER 20 MEQ PO TBCR
40.0000 meq | EXTENDED_RELEASE_TABLET | Freq: Once | ORAL | Status: AC
  Administered 2022-10-31: 40 meq via ORAL
  Filled 2022-10-31: qty 2

## 2022-10-31 NOTE — ED Notes (Signed)
Pt phone placed in belongings bag. Pt wants phone thrown away. Bag placed in cabinet.

## 2022-10-31 NOTE — ED Provider Notes (Signed)
[place] EMERGENCY DEPARTMENT AT [facility] Provider Note   CSN: [id number] Arrival date & time: 10/31/22  1831     History  Chief Complaint  Patient presents with   Altered Mental Status   Paranoid    Marie Ferguson is a 57 y.o. female.  Patient is a 57 year old who presents with manic type behavior.  EMS was called out because the son who reported that patient was acting confused.  EMS reports that patient was shouting out religious things.  They asked EMS to throw away her phone saying that there is an evil number on her phone.  On chart review, I see that she was recently admitted for a psychotic episode last month to [facility].  Prior to that there was no reported psychiatric history.  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  She denies any alcohol use.  She does admit to smoking marijuana but denies any other drug use.  She has a headache but denies any other physical complaints or recent illnesses.  She denies any hallucinations.  She denies any SI or HI.       Home Medications Prior to Admission medications   Medication Sig Start Date End Date Taking? Authorizing Provider  acetaminophen (TYLENOL) 325 MG tablet Take 650 mg by mouth every 6 (six) hours as needed.    [provider]  amLODipine (NORVASC) 10 MG tablet Take 1 tablet (10 mg total) by mouth daily. 05/28/22   Anders Simmonds, PA-C  amLODipine (NORVASC) 10 MG tablet Take 1 tablet (10 mg total) by mouth daily for hypertension. 09/13/22     atorvastatin (LIPITOR) 10 MG tablet Take 1 tablet (10 mg total) by mouth daily. 05/28/22   Anders Simmonds, PA-C  atorvastatin (LIPITOR) 10 MG tablet Take 1 tablet (10 mg total) by mouth at bedtime for cholesterol. 09/13/22     hydrochlorothiazide (HYDRODIURIL) 25 MG tablet Take 1 tablet (25 mg total) by mouth daily. 05/28/22   Anders Simmonds, PA-C  losartan (COZAAR) 25 MG tablet Take 1 tablet (25 mg total) by mouth daily. 05/28/22   Anders Simmonds, PA-C  losartan (COZAAR) 25 MG tablet Take 1 tablet (25 mg total) by mouth daily for hypertension. 09/13/22     OVER THE COUNTER MEDICATION Place 1 drop into both eyes daily as needed (irritation). Family care - lubricating eye drops.    [provider]  topiramate (TOPAMAX) 200 MG tablet Take 1 tablet (200 mg total) by mouth 2 (two) times daily. 09/04/22   Van Clines, MD  topiramate (TOPAMAX) 200 MG tablet Take 1 tablet (200 mg total) by mouth 2 (two) times daily for mood. 09/13/22         Allergies    Patient has no known allergies.    Review of Systems   Review of Systems  Constitutional:  Negative for chills, diaphoresis, fatigue and fever.  HENT:  Negative for congestion, rhinorrhea and sneezing.   Eyes: Negative.   Respiratory:  Negative for cough, chest tightness and shortness of breath.   Cardiovascular:  Negative for chest pain and leg swelling.  Gastrointestinal:  Negative for abdominal pain, blood in stool, diarrhea, nausea and vomiting.  Genitourinary:  Negative for difficulty urinating, flank pain, frequency and hematuria.  Musculoskeletal:  Negative for arthralgias and back pain.  Skin:  Negative for rash.  Neurological:  Positive for headaches. Negative for dizziness, speech difficulty, weakness and numbness.  Psychiatric/Behavioral:  Positive for behavioral problems.  Physical Exam Updated Vital Signs BP 93/66   Pulse 70   Temp 98.8 F (37.1 C) (Oral)   Resp 16   Ht 5' (1.524 m)   Wt 93.4 kg   SpO2 95%   BMI 40.23 kg/m  Physical Exam Constitutional:      Appearance: She is well-developed.  HENT:     Head: Normocephalic and atraumatic.  Eyes:     Pupils: Pupils are equal, round, and reactive to light.  Cardiovascular:     Rate and Rhythm: Normal rate and regular rhythm.     Heart sounds: Normal heart sounds.  Pulmonary:     Effort: Pulmonary effort is normal. No respiratory distress.     Breath sounds: Normal breath sounds. No  wheezing or rales.  Chest:     Chest wall: No tenderness.  Abdominal:     General: Bowel sounds are normal.     Palpations: Abdomen is soft.     Tenderness: There is no abdominal tenderness. There is no guarding or rebound.  Musculoskeletal:        General: Normal range of motion.     Cervical back: Normal range of motion and neck supple.  Lymphadenopathy:     Cervical: No cervical adenopathy.  Skin:    General: Skin is warm and dry.     Findings: No rash.  Neurological:     General: No focal deficit present.     Mental Status: She is alert and oriented to person, place, and time.     ED Results / Procedures / Treatments   Labs (all labs ordered are listed, but only abnormal results are displayed) Labs Reviewed  COMPREHENSIVE METABOLIC PANEL - Abnormal; Notable for the following components:      Result Value   Sodium 130 (*)    Potassium 2.5 (*)    Chloride 97 (*)    CO2 16 (*)    Total Protein 8.5 (*)    AST 12 (*)    Anion gap 17 (*)    All other components within normal limits  ETHANOL - Abnormal; Notable for the following components:   Alcohol, Ethyl (B) 32 (*)    All other components within normal limits  SALICYLATE LEVEL - Abnormal; Notable for the following components:   Salicylate Lvl <7.0 (*)    All other components within normal limits  ACETAMINOPHEN LEVEL - Abnormal; Notable for the following components:   Acetaminophen (Tylenol), Serum <10 (*)    All other components within normal limits  CBC - Abnormal; Notable for the following components:   Platelets 414 (*)    All other components within normal limits  RAPID URINE DRUG SCREEN, HOSP PERFORMED - Abnormal; Notable for the following components:   Tetrahydrocannabinol POSITIVE (*)    All other components within normal limits  MAGNESIUM    EKG EKG Interpretation Date/Time:  Saturday October 31 2022 19:00:10 EDT Ventricular Rate:  101 PR Interval:  171 QRS Duration:  93 QT Interval:  354 QTC  Calculation: 459 R Axis:   22  Text Interpretation: Sinus tachycardia Probable left atrial enlargement Baseline wander in lead(s) II aVR since last tracing no significant change Confirmed by Rolan Bucco [phone]) on 10/31/2022 7:36:32 PM  Radiology No results found.  Procedures Procedures    Medications Ordered in ED Medications  potassium chloride 10 mEq in 100 mL IVPB (10 mEq Intravenous New Bag/Given 10/31/22 2201)  OLANZapine zydis (ZYPREXA) disintegrating tablet 5 mg (5 mg Oral Given 10/31/22 1955)  acetaminophen (TYLENOL) tablet 650 mg (650 mg Oral Given 10/31/22 1955)  potassium chloride SA (KLOR-CON M) CR tablet 40 mEq (40 mEq Oral Given 10/31/22 2047)    ED Course/ Medical Decision Making/ A&P                             Medical Decision Making Amount and/or Complexity of Data Reviewed Labs: ordered.  Risk OTC drugs. Prescription drug management.   Patient is a 57 year old who presents with hallucinations per EMS with religious connotations.  She is fairly calm and cooperative currently.  She was given dose of Zyprexa.  Labs do show evidence of a hypokalemia.  She was started on potassium replacement, both IV and p.o.  Will need a repeat potassium and then can be medically cleared for TTS evaluation.  Care turned over to Dr. Blinda Leatherwood.  Final Clinical Impression(s) / ED Diagnoses Final diagnoses:  Psychosis, unspecified psychosis type (HCC)  Hypokalemia    Rx / DC Orders ED Discharge Orders     None         Rolan Bucco, MD 10/31/22 2244

## 2022-10-31 NOTE — ED Triage Notes (Signed)
Pt BIBA. EMS called out because son reported pt was acting confused. EMS reported pt was shouting religious things, and acting confused. Pt asked EMS to throw away phone saying there is "an evil number on their phone and they need it taken out of the hospital".   Pt AOx4

## 2022-11-01 LAB — POTASSIUM: Potassium: 3.6 mmol/L (ref 3.5–5.1)

## 2022-11-01 MED ORDER — ATORVASTATIN CALCIUM 10 MG PO TABS
10.0000 mg | ORAL_TABLET | Freq: Every day | ORAL | Status: DC
  Administered 2022-11-01: 10 mg via ORAL
  Filled 2022-11-01: qty 1

## 2022-11-01 MED ORDER — TOPIRAMATE 100 MG PO TABS
200.0000 mg | ORAL_TABLET | Freq: Two times a day (BID) | ORAL | Status: DC
  Administered 2022-11-01 (×2): 200 mg via ORAL
  Filled 2022-11-01 (×2): qty 2

## 2022-11-01 MED ORDER — ACETAMINOPHEN 325 MG PO TABS: 650.0000 mg | ORAL_TABLET | Freq: Four times a day (QID) | ORAL | Status: DC | PRN

## 2022-11-01 MED ORDER — HYDROCHLOROTHIAZIDE 12.5 MG PO TABS
25.0000 mg | ORAL_TABLET | Freq: Every day | ORAL | Status: DC
  Administered 2022-11-01: 25 mg via ORAL
  Filled 2022-11-01: qty 2

## 2022-11-01 MED ORDER — AMLODIPINE BESYLATE 5 MG PO TABS
10.0000 mg | ORAL_TABLET | Freq: Every day | ORAL | Status: DC
  Administered 2022-11-01: 10 mg via ORAL
  Filled 2022-11-01: qty 2

## 2022-11-01 MED ORDER — LOSARTAN POTASSIUM 25 MG PO TABS
25.0000 mg | ORAL_TABLET | Freq: Every day | ORAL | Status: DC
  Administered 2022-11-01: 25 mg via ORAL
  Filled 2022-11-01: qty 1

## 2022-11-01 NOTE — ED Notes (Signed)
Breakfast provided.

## 2022-11-01 NOTE — ED Provider Notes (Signed)
Patient evaluated by psych service and felt to be reasonable safe and stable for outpatient management.  hypoK corrected in the ED here.   Terald Sleeper, MD 11/01/22 [phone]

## 2022-11-01 NOTE — ED Provider Notes (Signed)
Received patient in signout.  Patient requiring psychiatric evaluation but did have moderate hypokalemia.  Patient had been given supplemental potassium and repeat is now normal.  Patient medically clear for psychiatric treatment.   Gilda Crease, MD 11/01/22 [phone]

## 2022-11-01 NOTE — BH Assessment (Signed)
Comprehensive Clinical Assessment (CCA) Note  11/01/2022 Marie Ferguson [id number]  Disposition:  Marie Bering, NP, psych cleared. Patient will follow up with outpatient therapy, medication management. Patient will also continue to follow up with neurology appointments. Marie Deer, RN, informed of disposition.   The patient demonstrates the following risk factors for suicide: Chronic risk factors for suicide include: psychiatric disorder of depression and anxiety and medical illness seizures . Acute risk factors for suicide include: loss (financial, interpersonal, professional). Protective factors for this patient include: responsibility to others (children, family), coping skills, hope for the future, and religious beliefs against suicide. Considering these factors, the overall suicide risk at this point appears to be low. Patient is appropriate for outpatient follow up.  Marie Ferguson is a 57 year old female presenting voluntary to [facility] due to altered mental status. Patient denied SI, HI, psychosis and alcohol usage. Patient called EMS after he noticed mother acting confused and shouting religious statements. Per triage note, patient asked EMS to throw her phone away, saying "an evil number on their phone and they need it taken out of the hospital".  When asked, why are you here, patient stated "panic attack, I don't think it was a seizure, I dosed and took a nap, my head was bothering me but now I am feeling normal". Patient reported main stressors include, not having a job due to her seizures in 04/2021. Also, patient reports lack of income and that her daughter is paying all of her rent and bills. Patient reports applying for disability and waiting for approval. Patient reported worsening depressive symptoms. Patient denied prior suicide attempts and self-harming behaviors. Patient reported sleeping 2 hours nightly and normal appetite.   PER TRIAGE NOTE 10/31/22: Pt BIBA. EMS called out because son  reported pt was acting confused. EMS reported pt was shouting religious things, and acting confused. Pt asked EMS to throw away phone saying there is "an evil number on their phone and they need it taken out of the hospital".   Patient denied receiving outpatient mental health resources. Patient denied being prescribed any psych medications. Patient was last inpatient 08/2022 at [facility] due to same presentation of altered mental status.   Patient currently resides alone and is single. Patient reports having a good relationship with her 3 children. Patient has been unemployed since 04/2021 due to seizure disorder. Patient reported that her daughter is currently paying her rent. Patient has no income and reports that she is trying to get her disability approved. Patient denied access to guns. Patient was calm and cooperative during assessment. Patient contracts for safety.   Collateral Contact: Marie Ferguson, daughter, [phone]. Patient gave consent to contact daughter for additional information. Marie Ferguson had no concerns regarding patient being suicidal or homicidal. Daughter reported patient was at [employer] in 08/2022. Daughter patient has been stressed out with finances. Daughter reported that patient needs to continue with her scheduled neurological appointments.   Chief Complaint:  Chief Complaint  Patient presents with   Altered Mental Status   Paranoid   Visit Diagnosis: Psychosis    CCA Screening, Triage and Referral (STR)  Patient Reported Information How did you hear about [place]? Family/Friend  What Is the Reason for Your Visit/Call Today? Altered mental status.  How Long Has This Been Causing You Problems? <Week  What Do You Feel Would Help You the Most Today? Treatment for Depression or other mood problem   Have You Recently Had Any Thoughts About Hurting Yourself? No  Are You Planning  to Commit Suicide/Harm Yourself At This time? No   Flowsheet Row ED from  10/31/2022 in [facility] Emergency Department at [facility] ED from 09/09/2022 in [facility] Emergency Department at [facility] ED from 09/08/2022 in [facility] Emergency Department at [facility]  C-SSRS RISK CATEGORY No Risk No Risk No Risk       Have you Recently Had Thoughts About Hurting Someone Marie Ferguson? No  Are You Planning to Harm Someone at This Time? No  Explanation: n/a   Have You Used Any Alcohol or Drugs in the Past 24 Hours? No  What Did You Use and How Much? n/a   Do You Currently Have a Therapist/Psychiatrist? No  Name of Therapist/Psychiatrist: Name of Therapist/Psychiatrist: n/a   Have You Been Recently Discharged From Any Office Practice or Programs? No  Explanation of Discharge From Practice/Program: n/a     CCA Screening Triage Referral Assessment Type of Contact: Tele-Assessment  Telemedicine Service Delivery: Telemedicine service delivery: This service was provided via telemedicine using a 2-way, interactive audio and video technology  Is this Initial or Reassessment? Is this Initial or Reassessment?: Initial Assessment  Date Telepsych consult ordered in [facility]:  Date Telepsych consult ordered in [facility]: 11/01/22  Time Telepsych consult ordered in [facility]:  Time Telepsych consult ordered in [facility]: 0121  Location of Assessment: [facility] ED  Provider Location: [facility] Assessment Services   Collateral Involvement: Marie Ferguson, daughter, [phone]   Does Patient Have a Court Appointed Legal Guardian? No  Legal Guardian Contact Information: n/a  Copy of Legal Guardianship Form: -- (n/a)  Legal Guardian Notified of Arrival: -- (n/a)  Legal Guardian Notified of Pending Discharge: -- (n/a)  If Minor and Not Living with Parent(s), Who has Custody? n/a  Is CPS involved or ever been involved? Never  Is APS involved or ever been involved? Never   Patient Determined To Be At Risk for Harm To Self or Others Based on Review of Patient  Reported Information or Presenting Complaint? No  Method: No Plan  Availability of Means: No access or NA  Intent: Vague intent or NA  Notification Required: No need or identified person  Additional Information for Danger to Others Potential: -- (n/a)  Additional Comments for Danger to Others Potential: n/a  Are There Guns or Other Weapons in Your Home? No  Types of Guns/Weapons: Pt denies access to any weapons/ guns.  Are These Weapons Safely Secured?                            Yes  Who Could Verify You Are Able To Have These Secured: Pt denies access to any weapons/ guns.  Do You Have any Outstanding Charges, Pending Court Dates, Parole/Probation? none reported  Contacted To Inform of Risk of Harm To Self or Others: Family/Significant Other:    Does Patient Present under Involuntary Commitment? No    [place] of Residence: [place]   Patient Currently Receiving the Following Services: Not Receiving Services   Determination of Need: Urgent (48 hours)   Options For Referral: Medication Management; Outpatient Therapy     CCA Biopsychosocial Patient Reported Schizophrenia/Schizoaffective Diagnosis in Past: No   Strengths: self-awareness   Mental Health Symptoms Depression:   Hopelessness; Fatigue; Difficulty Concentrating; Sleep (too much or little)   Duration of Depressive symptoms:  Duration of Depressive Symptoms: Greater than two weeks   Mania:   None   Anxiety:    None;  Worrying; Tension; Sleep   Psychosis:   None   Duration of Psychotic symptoms:    Trauma:   None   Obsessions:   None   Compulsions:   None   Inattention:   None   Hyperactivity/Impulsivity:   None   Oppositional/Defiant Behaviors:   None   Emotional Irregularity:   None   Other Mood/Personality Symptoms:   NA    Mental Status Exam Appearance and self-care  Stature:   Average   Weight:   Average weight   Clothing:   Casual   Grooming:    Normal   Cosmetic use:   None   Posture/gait:   Normal   Motor activity:   Not Remarkable   Sensorium  Attention:   Normal   Concentration:   Normal   Orientation:   Time; Situation; Place; Person   Recall/memory:   Normal   Affect and Mood  Affect:   Anxious   Mood:   Anxious   Relating  Eye contact:   Normal   Facial expression:   Anxious; Sad   Attitude toward examiner:   Cooperative   Thought and Language  Speech flow:  Clear and Coherent   Thought content:   Appropriate to Mood and Circumstances   Preoccupation:   None   Hallucinations:   None   Organization:   Coherent   [employer] of Knowledge:   Average   Intelligence:   Average   Abstraction:   Normal   Judgement:   Fair   Dance movement psychotherapist:   Realistic   Insight:   Fair   Decision Making:   Normal   Social Functioning  Social Maturity:   Isolates   Social Judgement:   Normal   Stress  Stressors:   Other (Comment); Financial   Coping Ability:   Exhausted; Overwhelmed   Skill Deficits:   Decision making   Supports:   Family     Religion: Religion/Spirituality Are You A Religious Person?: Yes What is Your Religious Affiliation?: Christian How Might This Affect Treatment?: NA  Leisure/Recreation: Leisure / Recreation Do You Have Hobbies?: Yes Leisure and Hobbies: play with grandchildren, reading and going on a walk  Exercise/Diet: Exercise/Diet Do You Exercise?: No Have You Gained or Lost A Significant Amount of Weight in the Past Six Months?: No Do You Follow a Special Diet?: No Do You Have Any Trouble Sleeping?: Yes Explanation of Sleeping Difficulties: 2 hours nightly   CCA Employment/Education Employment/Work Situation: Employment / Work Situation Employment Situation: Unemployed Patient's Job has Been Impacted by Current Illness: No Has Patient ever Been in Equities trader?: No  Education: Education Is Patient  Currently Attending School?: No Last Grade Completed: 12 Did You Product manager?: No Did You Have An Individualized Education Program (IIEP): No Did You Have Any Difficulty At [employer]?: No Patient's Education Has Been Impacted by Current Illness: No   CCA Family/Childhood History Family and Relationship History: Family history Marital status: Single Does patient have children?: Yes How many children?: 3 How is patient's relationship with their children?: Pt reports a good relationship  Childhood History:  Childhood History By whom was/is the patient raised?: Mother Did patient suffer any verbal/emotional/physical/sexual abuse as a child?: No Did patient suffer from severe childhood neglect?: No Has patient ever been sexually abused/assaulted/raped as an adolescent or adult?: No Was the patient ever a victim of a crime or a disaster?: No Witnessed domestic violence?: No Has patient been affected by domestic violence as  an adult?: No       CCA Substance Use Alcohol/Drug Use: Alcohol / Drug Use Pain Medications: See MAR Prescriptions: See MAR Over the Counter: See MAR History of alcohol / drug use?: Yes Longest period of sobriety (when/how long): UTA Negative Consequences of Use: Personal relationships Withdrawal Symptoms: None                         ASAM's:  Six Dimensions of Multidimensional Assessment  Dimension 1:  Acute Intoxication and/or Withdrawal Potential:   Dimension 1:  Description of individual's past and current experiences of substance use and withdrawal: n/a  Dimension 2:  Biomedical Conditions and Complications:   Dimension 2:  Description of patient's biomedical conditions and  complications: n/a  Dimension 3:  Emotional, Behavioral, or Cognitive Conditions and Complications:  Dimension 3:  Description of emotional, behavioral, or cognitive conditions and complications: n/a  Dimension 4:  Readiness to Change:  Dimension 4:  Description of  Readiness to Change criteria: n/a  Dimension 5:  Relapse, Continued use, or Continued Problem Potential:  Dimension 5:  Relapse, continued use, or continued problem potential critiera description: n/a  Dimension 6:  Recovery/Living Environment:  Dimension 6:  Recovery/Iiving environment criteria description: n/a  ASAM Severity Score:    ASAM Recommended Level of Treatment: ASAM Recommended Level of Treatment:  (n/a)   Substance use Disorder (SUD) Substance Use Disorder (SUD)  Checklist Symptoms of Substance Use:  (n/a)  Recommendations for Services/Supports/Treatments: Recommendations for Services/Supports/Treatments Recommendations For Services/Supports/Treatments: Medication Management, Peer Support Services, Individual Therapy (n/a)  Discharge Disposition: Discharge Disposition Medical Exam completed: Yes  DSM5 Diagnoses: Patient Active Problem List   Diagnosis Date Noted   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   Seizure disorder (HCC) 08/11/2021   Seizure-like activity (HCC) 02/12/2021   Prediabetes 07/30/2019   Intraductal papilloma of left breast 06/22/2019   Screening breast examination 12/29/2018   Breast pain, left 12/29/2018   Hyperlipidemia, mixed 12/08/2018   Hypertensive retinopathy of both eyes 11/07/2018   Essential hypertension 11/07/2018   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

## 2022-11-01 NOTE — Discharge Instructions (Addendum)
For medical issues, you were treated with potassium for low potassium levels in the ER, and your repeat blood work showed that your potassium level had normalized.  For your psychological concerns or mental health concerns, you were seen and assessed by behavioral health specialist, who did not feel that you needed emergency psychiatric hospitalization at this time.  We recommended outpatient follow-up.  Please contact your own counselor or else make an appointment at the behavioral health center at the number above.  Continue taking your normal medications at home.

## 2022-11-01 NOTE — ED Notes (Signed)
Marie Bering, NP, patient is psych cleared. Patient will follow up with outpatient resources and neurology appt.

## 2022-11-04 ENCOUNTER — Other Ambulatory Visit: Payer: Self-pay

## 2022-11-18 ENCOUNTER — Other Ambulatory Visit: Payer: Self-pay

## 2022-11-19 ENCOUNTER — Telehealth: Payer: Self-pay | Admitting: Internal Medicine

## 2022-11-19 ENCOUNTER — Other Ambulatory Visit: Payer: Self-pay

## 2022-11-19 MED ORDER — NICOTINE 14 MG/24HR TD PT24
14.0000 mg | MEDICATED_PATCH | Freq: Every day | TRANSDERMAL | 2 refills | Status: DC
  Filled 2022-11-19: qty 28, 28d supply, fill #0

## 2022-11-19 NOTE — Telephone Encounter (Signed)
Pt called requesting to receive a cigarette smoking cessation patch, says it will be covered by her insurance but she does not know the name of it. She is requesting this now so she will be able to get it from the pharmacy to consolidate her commute to one trip since she will be coming for an appt next Thursday 11/26/2022. I asked her to contact her insurance to find out exactly which brand/dose they will cover but she declined.   [facility] - [facility]  [address], [address] [place] [address]  Phone: [phone] Fax: [phone]

## 2022-11-19 NOTE — Telephone Encounter (Signed)
Called & spoke to the patient. Verified name & DOB. Patient stated that one pack of cigarettes will last 4 days. Please advise.

## 2022-11-20 ENCOUNTER — Other Ambulatory Visit: Payer: Self-pay

## 2022-11-20 ENCOUNTER — Telehealth: Payer: Self-pay | Admitting: Neurology

## 2022-11-20 NOTE — Telephone Encounter (Signed)
Ok to put on cancellation list, thanks

## 2022-11-20 NOTE — Telephone Encounter (Signed)
Patients daughter called, Patient has been in hospital several different times, a couple bad seizure episodes, ended up in a mental hospital for week, hospital doctors told daughter that she needs to be seen sonner then 01/13/23 (original appointment)/KB

## 2022-11-20 NOTE — Telephone Encounter (Signed)
Patient called me back she stated that she is taking her Keppra and she has only had one seizure awhile ago not recently at all. Patient said she felt fine and the hospital visit was regarding something else. I did let patient know if she needed anything or if she felt her meds were not working or she started having seizures to call us. Patient knew Dr. Karel Jarvis will be out of the office and she didn't feel she needed a sooner appointment

## 2022-11-20 NOTE — Telephone Encounter (Signed)
Called & spoke to the patient. Verified name & DOB. Informed patient that prescription was sent to the pharmacy. Patient expressed verbal understanding.

## 2022-11-20 NOTE — Telephone Encounter (Signed)
Patient called to return call/KB

## 2022-11-20 NOTE — Telephone Encounter (Signed)
Patients daughter is not on DPR and I am not able to speak to her Called patient and left voicemail message. Patient WAS IN HOSPITAL FOR MENTAL HEALTH ISSUES AND I DO NOT SEE ANY SEIZURE ACTIVITY BUT WOULD LIKE TO GET MORE INFORMATION FROM THE PATIENT

## 2022-11-23 ENCOUNTER — Other Ambulatory Visit: Payer: Self-pay

## 2022-11-26 ENCOUNTER — Ambulatory Visit: Payer: Medicaid Other | Admitting: Internal Medicine

## 2022-11-27 ENCOUNTER — Ambulatory Visit: Payer: MEDICAID | Admitting: Internal Medicine

## 2022-11-30 NOTE — Progress Notes (Unsigned)
   Established Patient Office Visit  Subjective   Patient ID: Marie Ferguson, female    DOB: 1966/01/06  Age: 57 y.o. MRN: [id number]  No chief complaint on file.   HPI  {History (Optional):23778}  ROS    Objective:     There were no vitals taken for this visit. {Vitals History (Optional):23777}  Physical Exam   No results found for any visits on 12/01/22.  {Labs (Optional):23779}  The 10-year ASCVD risk score (Arnett DK, et al., 2019) is: 9.4%    Assessment & Plan:   Problem List Items Addressed This Visit   None   No follow-ups on file.    Shan Levans, MD

## 2022-12-01 ENCOUNTER — Other Ambulatory Visit: Payer: Self-pay

## 2022-12-01 ENCOUNTER — Ambulatory Visit: Payer: MEDICAID | Attending: Internal Medicine | Admitting: Critical Care Medicine

## 2022-12-01 ENCOUNTER — Encounter: Payer: Self-pay | Admitting: Critical Care Medicine

## 2022-12-01 VITALS — BP 120/82 | HR 80 | Wt 187.8 lb

## 2022-12-01 DIAGNOSIS — G40909 Epilepsy, unspecified, not intractable, without status epilepticus: Secondary | ICD-10-CM | POA: Diagnosis not present

## 2022-12-01 DIAGNOSIS — R7303 Prediabetes: Secondary | ICD-10-CM

## 2022-12-01 DIAGNOSIS — F19959 Other psychoactive substance use, unspecified with psychoactive substance-induced psychotic disorder, unspecified: Secondary | ICD-10-CM

## 2022-12-01 DIAGNOSIS — I10 Essential (primary) hypertension: Secondary | ICD-10-CM | POA: Diagnosis not present

## 2022-12-01 DIAGNOSIS — Z1231 Encounter for screening mammogram for malignant neoplasm of breast: Secondary | ICD-10-CM

## 2022-12-01 DIAGNOSIS — E782 Mixed hyperlipidemia: Secondary | ICD-10-CM | POA: Diagnosis not present

## 2022-12-01 DIAGNOSIS — N63 Unspecified lump in unspecified breast: Secondary | ICD-10-CM

## 2022-12-01 MED ORDER — LOSARTAN POTASSIUM 25 MG PO TABS
25.0000 mg | ORAL_TABLET | Freq: Every day | ORAL | 1 refills | Status: DC
Start: 2022-12-01 — End: 2023-07-13
  Filled 2022-12-01 – 2023-04-09 (×3): qty 90, 90d supply, fill #0
  Filled 2023-07-03: qty 90, 90d supply, fill #1

## 2022-12-01 MED ORDER — HYDROCHLOROTHIAZIDE 25 MG PO TABS
25.0000 mg | ORAL_TABLET | Freq: Every day | ORAL | 1 refills | Status: DC
Start: 2022-12-01 — End: 2023-07-13
  Filled 2022-12-01 – 2023-01-05 (×2): qty 90, 90d supply, fill #0
  Filled 2023-04-09: qty 90, 90d supply, fill #1

## 2022-12-01 MED ORDER — AMLODIPINE BESYLATE 10 MG PO TABS
10.0000 mg | ORAL_TABLET | Freq: Every day | ORAL | 1 refills | Status: DC
Start: 2022-12-01 — End: 2023-07-13
  Filled 2022-12-01 – 2023-01-05 (×2): qty 90, 90d supply, fill #0
  Filled 2023-04-09: qty 90, 90d supply, fill #1

## 2022-12-01 MED ORDER — ATORVASTATIN CALCIUM 10 MG PO TABS
10.0000 mg | ORAL_TABLET | Freq: Every day | ORAL | 1 refills | Status: DC
Start: 2022-12-01 — End: 2023-07-13
  Filled 2022-12-01 – 2023-01-06 (×2): qty 90, 90d supply, fill #0
  Filled 2023-04-09: qty 90, 90d supply, fill #1

## 2022-12-01 NOTE — Assessment & Plan Note (Signed)
Continue with diet.

## 2022-12-01 NOTE — Assessment & Plan Note (Signed)
Inactive at this time we will monitor

## 2022-12-01 NOTE — Assessment & Plan Note (Signed)
Continue statin. 

## 2022-12-01 NOTE — Assessment & Plan Note (Signed)
Maintain for now topiramate

## 2022-12-01 NOTE — Assessment & Plan Note (Signed)
Previous biopsy showed sclerotic tissue left breast will order diagnostic mammogram and she is due

## 2022-12-01 NOTE — Patient Instructions (Signed)
No medication changes, all refills sent downstairs  Call us if you want shingles and tetanus vaccines  Labs today  Return Dr Laural Benes 4 months

## 2022-12-01 NOTE — Assessment & Plan Note (Signed)
Hypertension at goal refills given on amlodipine hydrochlorothiazide losartan and check metabolic panel with potassium

## 2022-12-02 NOTE — Progress Notes (Signed)
Let patient know body chemistries and kidneys are normal

## 2022-12-03 ENCOUNTER — Telehealth: Payer: Self-pay

## 2022-12-03 NOTE — Telephone Encounter (Signed)
Pt was called and is aware of results, DOB was confirmed.  ?

## 2022-12-03 NOTE — Telephone Encounter (Signed)
-----   Message from Shan Levans sent at 12/02/2022  3:32 PM EDT ----- Let patient know body chemistries and kidneys are normal

## 2022-12-08 ENCOUNTER — Other Ambulatory Visit: Payer: Self-pay

## 2023-01-05 ENCOUNTER — Encounter: Payer: Self-pay | Admitting: Neurology

## 2023-01-05 ENCOUNTER — Other Ambulatory Visit: Payer: Self-pay

## 2023-01-05 ENCOUNTER — Ambulatory Visit (INDEPENDENT_AMBULATORY_CARE_PROVIDER_SITE_OTHER): Payer: MEDICAID | Admitting: Neurology

## 2023-01-05 VITALS — BP 114/78 | HR 89 | Ht 60.0 in | Wt 181.4 lb

## 2023-01-05 DIAGNOSIS — G40009 Localization-related (focal) (partial) idiopathic epilepsy and epileptic syndromes with seizures of localized onset, not intractable, without status epilepticus: Secondary | ICD-10-CM | POA: Diagnosis not present

## 2023-01-05 MED ORDER — OXCARBAZEPINE 300 MG PO TABS
300.0000 mg | ORAL_TABLET | Freq: Two times a day (BID) | ORAL | 6 refills | Status: DC
  Filled 2023-01-05: qty 60, 30d supply, fill #0

## 2023-01-05 MED ORDER — VALTOCO 20 MG DOSE 10 MG/0.1ML NA LQPK
NASAL | 5 refills | Status: AC
  Filled 2023-01-05: qty 2, 30d supply, fill #0
  Filled 2023-04-09 – 2023-07-03 (×2): qty 2, 30d supply, fill #1

## 2023-01-05 NOTE — Patient Instructions (Signed)
Good to see you.  Schedule MRI brain with and without contrast  2. Schedule 1-hour EEG  3. Start Oxcarbazepine 300mg : Take 1 tablet twice a day  4. Continue Topiramate 200mg : take 1 tablet twice a day  5. May use Valtoco nasal spray as needed for seizure  6. Follow-up in 1 month, call for any changes   Seizure Precautions: 1. If medication has been prescribed for you to prevent seizures, take it exactly as directed.  Do not stop taking the medicine without talking to your doctor first, even if you have not had a seizure in a long time.   2. Avoid activities in which a seizure would cause danger to yourself or to others.  Don't operate dangerous machinery, swim alone, or climb in high or dangerous places, such as on ladders, roofs, or girders.  Do not drive unless your doctor says you may.  3. If you have any warning that you may have a seizure, lay down in a safe place where you can't hurt yourself.    4.  No driving for 6 months from last seizure, as per [facility].   Please refer to the following link on the Epilepsy Foundation of America's website for more information: http://www.epilepsyfoundation.org/answerplace/Social/driving/drivingu.cfm   5.  Maintain good sleep hygiene.  6.  Contact your doctor if you have any problems that may be related to the medicine you are taking.  7.  Call 911 and bring the patient back to the ED if:        A.  The seizure lasts longer than 5 minutes.       B.  The patient doesn't awaken shortly after the seizure  C.  The patient has new problems such as difficulty seeing, speaking or moving  D.  The patient was injured during the seizure  E.  The patient has a temperature over 102 F (39C)  F.  The patient vomited and now is having trouble breathing
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
## 2023-01-06 ENCOUNTER — Other Ambulatory Visit: Payer: Self-pay

## 2023-01-12 ENCOUNTER — Telehealth: Payer: Self-pay | Admitting: Neurology

## 2023-01-12 DIAGNOSIS — G40009 Localization-related (focal) (partial) idiopathic epilepsy and epileptic syndromes with seizures of localized onset, not intractable, without status epilepticus: Secondary | ICD-10-CM

## 2023-01-12 NOTE — Addendum Note (Signed)
Addended by: Dimas Chyle on: 01/12/2023 12:02 PM   Modules accepted: Orders

## 2023-01-12 NOTE — Telephone Encounter (Signed)
Pt called in stating she tried to schedule her MRI, but they do not take her insurance. They suggested having it done at a hospital.

## 2023-01-12 NOTE — Telephone Encounter (Signed)
Pt called an scheduled for her 1 hour eeg she is going to come in tomorrow at 930 for her eeg. It advised that will work on getting her MRI scheduled at the hospital. Pt appointment for 01/13/23 was cancelled she is scheduled for October,

## 2023-01-13 ENCOUNTER — Ambulatory Visit: Payer: Medicaid Other | Admitting: Neurology

## 2023-01-13 ENCOUNTER — Other Ambulatory Visit: Payer: MEDICAID

## 2023-01-15 ENCOUNTER — Telehealth: Payer: Self-pay | Admitting: Internal Medicine

## 2023-01-15 ENCOUNTER — Other Ambulatory Visit: Payer: Self-pay

## 2023-01-15 DIAGNOSIS — G40909 Epilepsy, unspecified, not intractable, without status epilepticus: Secondary | ICD-10-CM

## 2023-01-15 DIAGNOSIS — G40009 Localization-related (focal) (partial) idiopathic epilepsy and epileptic syndromes with seizures of localized onset, not intractable, without status epilepticus: Secondary | ICD-10-CM

## 2023-01-15 NOTE — Telephone Encounter (Signed)
Spoke with Shamira at [facility] imaging. Advised the this office is not the ordering office for the patient MRI. MRI was ordered by Van Clines, MD with Neurology.

## 2023-01-15 NOTE — Telephone Encounter (Signed)
[place] with [facility] recieved multiple orders for an MRI for patient but [place] states that they are out of network with her insurance. [place] also states patient told PCP that [facility] was out of network and she would like to be sent somewhere else for this. Please advise.

## 2023-01-15 NOTE — Telephone Encounter (Signed)
Noted  

## 2023-01-19 ENCOUNTER — Ambulatory Visit: Payer: MEDICAID | Admitting: Neurology

## 2023-01-19 DIAGNOSIS — G40009 Localization-related (focal) (partial) idiopathic epilepsy and epileptic syndromes with seizures of localized onset, not intractable, without status epilepticus: Secondary | ICD-10-CM | POA: Diagnosis not present

## 2023-01-19 NOTE — Progress Notes (Signed)
EEG complete - results pending 

## 2023-01-21 ENCOUNTER — Ambulatory Visit (HOSPITAL_COMMUNITY)
Admission: RE | Admit: 2023-01-21 | Discharge: 2023-01-21 | Disposition: A | Discharge status: Home / Self Care | Payer: MEDICAID | Source: Ambulatory Visit | Attending: Neurology | Admitting: Neurology

## 2023-01-21 DIAGNOSIS — G40009 Localization-related (focal) (partial) idiopathic epilepsy and epileptic syndromes with seizures of localized onset, not intractable, without status epilepticus: Secondary | ICD-10-CM | POA: Diagnosis present

## 2023-01-21 DIAGNOSIS — G40909 Epilepsy, unspecified, not intractable, without status epilepticus: Secondary | ICD-10-CM | POA: Diagnosis present

## 2023-01-21 MED ORDER — GADOBUTROL 1 MMOL/ML IV SOLN
7.5000 mL | Freq: Once | INTRAVENOUS | Status: AC | PRN
  Administered 2023-01-21: 7.5 mL via INTRAVENOUS

## 2023-02-03 NOTE — Procedures (Signed)
ELECTROENCEPHALOGRAM REPORT  Date of Study: 01/19/2023  Patient's Name: Marie Ferguson MRN: [id number] Date of Birth: 21-Jan-1966  Referring Provider: Dr. Patrcia Dolly  Clinical History: This is a 57 year old woman with recurrent seizures and episodes of significant behavioral changes/psychosis. EEG for classification.  Seizure Medications: Topiramate, Oxcarbazepine  Technical Summary: A multichannel digital 1-hour EEG recording measured by the international 10-20 system with electrodes applied with paste and impedances below 5000 ohms performed in our laboratory with EKG monitoring in an awake and asleep patient.  Hyperventilation was not performed. Photic stimulation was performed.  The digital EEG was referentially recorded, reformatted, and digitally filtered in a variety of bipolar and referential montages for optimal display.    Description: The patient is awake and asleep during the recording.  During maximal wakefulness, there is a symmetric, medium voltage 8 Hz posterior dominant rhythm that attenuates with eye opening.  There is occasional focal 2-3 Hz delta slowing seen over the right temporal region. During drowsiness and sleep, there is an increase in theta slowing of the background.  Vertex waves and symmetric sleep spindles were seen. Photic stimulation did not elicit any abnormalities.  During sleep, there were occasional spikes/polyspikes and sharp waves seen over the bilateral frontal regions, some occurring synchronously, but at times also occurring independently over the left and right frontal regions. There were no electrographic seizures seen.    EKG lead was unremarkable.  Impression: This 1-hour awake and asleep EEG is abnormal due to the presence of: Occasional focal slowing over the right temporal region Independent and bisynchronous frontal epileptiform discharges seen exclusively in sleep  Clinical Correlation of the above findings indicates focal cerebral dysfunction  over the right temporal region suggestive of underlying structural or physiologic abnormality. There is a tendency for seizures to arise from the bilateral frontal regions. Clinical correlation is advised.    Patrcia Dolly, M.D.

## 2023-02-05 ENCOUNTER — Other Ambulatory Visit: Payer: Self-pay

## 2023-02-05 ENCOUNTER — Ambulatory Visit (INDEPENDENT_AMBULATORY_CARE_PROVIDER_SITE_OTHER): Payer: MEDICAID | Admitting: Neurology

## 2023-02-05 ENCOUNTER — Encounter: Payer: Self-pay | Admitting: Neurology

## 2023-02-05 VITALS — BP 122/81 | HR 87 | Ht 60.0 in | Wt 186.6 lb

## 2023-02-05 DIAGNOSIS — G40009 Localization-related (focal) (partial) idiopathic epilepsy and epileptic syndromes with seizures of localized onset, not intractable, without status epilepticus: Secondary | ICD-10-CM

## 2023-02-05 MED ORDER — TOPIRAMATE 200 MG PO TABS
200.0000 mg | ORAL_TABLET | Freq: Two times a day (BID) | ORAL | 3 refills | Status: DC
  Filled 2023-02-05: qty 180, 90d supply, fill #0
  Filled 2023-05-02: qty 180, 90d supply, fill #1

## 2023-02-05 MED ORDER — OXCARBAZEPINE 300 MG PO TABS
300.0000 mg | ORAL_TABLET | Freq: Two times a day (BID) | ORAL | 3 refills | Status: DC
  Filled 2023-02-05: qty 180, 90d supply, fill #0
  Filled 2023-05-02: qty 180, 90d supply, fill #1
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
## 2023-02-05 NOTE — Patient Instructions (Signed)
Good to see you doing better!  Continue Oxcarbazepine 300mg  twice a day  2. Continue Topiramate 200mg  twice a day  3. Follow-up in 3 months, call for any changes   Seizure Precautions: 1. If medication has been prescribed for you to prevent seizures, take it exactly as directed.  Do not stop taking the medicine without talking to your doctor first, even if you have not had a seizure in a long time.   2. Avoid activities in which a seizure would cause danger to yourself or to others.  Don't operate dangerous machinery, swim alone, or climb in high or dangerous places, such as on ladders, roofs, or girders.  Do not drive unless your doctor says you may.  3. If you have any warning that you may have a seizure, lay down in a safe place where you can't hurt yourself.    4.  No driving for 6 months from last seizure, as per [facility].   Please refer to the following link on the [employer]'s website for more information: http://www.epilepsyfoundation.org/answerplace/Social/driving/drivingu.cfm   5.  Maintain good sleep hygiene. Avoid alcohol.  6.  Contact your doctor if you have any problems that may be related to the medicine you are taking.  7.  Call 911 and bring the patient back to the ED if:        A.  The seizure lasts longer than 5 minutes.       B.  The patient doesn't awaken shortly after the seizure  C.  The patient has new problems such as difficulty seeing, speaking or moving  D.  The patient was injured during the seizure  E.  The patient has a temperature over 102 F (39C)  F.  The patient vomited and now is having trouble breathing

## 2023-02-08 ENCOUNTER — Other Ambulatory Visit: Payer: Self-pay

## 2023-02-13 ENCOUNTER — Other Ambulatory Visit: Payer: MEDICAID

## 2023-03-04 ENCOUNTER — Other Ambulatory Visit: Payer: Self-pay

## 2023-03-16 ENCOUNTER — Other Ambulatory Visit: Payer: Self-pay

## 2023-04-09 ENCOUNTER — Other Ambulatory Visit: Payer: Self-pay

## 2023-04-09 ENCOUNTER — Other Ambulatory Visit (HOSPITAL_BASED_OUTPATIENT_CLINIC_OR_DEPARTMENT_OTHER): Payer: Self-pay

## 2023-05-03 ENCOUNTER — Other Ambulatory Visit: Payer: Self-pay

## 2023-05-21 ENCOUNTER — Encounter: Payer: Self-pay | Admitting: Neurology

## 2023-05-21 ENCOUNTER — Other Ambulatory Visit: Payer: Self-pay

## 2023-05-21 ENCOUNTER — Ambulatory Visit (INDEPENDENT_AMBULATORY_CARE_PROVIDER_SITE_OTHER): Payer: MEDICAID | Admitting: Neurology

## 2023-05-21 VITALS — BP 132/87 | HR 77 | Ht 60.0 in | Wt 189.6 lb

## 2023-05-21 DIAGNOSIS — G40009 Localization-related (focal) (partial) idiopathic epilepsy and epileptic syndromes with seizures of localized onset, not intractable, without status epilepticus: Secondary | ICD-10-CM | POA: Diagnosis not present

## 2023-05-21 MED ORDER — OXCARBAZEPINE 300 MG PO TABS
300.0000 mg | ORAL_TABLET | Freq: Two times a day (BID) | ORAL | 3 refills | Status: DC
  Filled 2023-05-21 – 2023-08-04 (×3): qty 180, 90d supply, fill #0
  Filled 2023-10-11: qty 180, 90d supply, fill #1

## 2023-05-21 MED ORDER — TOPIRAMATE 200 MG PO TABS
200.0000 mg | ORAL_TABLET | Freq: Two times a day (BID) | ORAL | 3 refills | Status: DC
  Filled 2023-05-21 – 2023-08-25 (×3): qty 180, 90d supply, fill #0
  Filled 2023-10-11 – 2023-11-22 (×2): qty 180, 90d supply, fill #1

## 2023-05-21 NOTE — Patient Instructions (Signed)
Good to see you. Continue all your medications. Discuss hearing evaluation with PCP. Follow-up in 3 months, call for any changes.    Seizure Precautions: 1. If medication has been prescribed for you to prevent seizures, take it exactly as directed.  Do not stop taking the medicine without talking to your doctor first, even if you have not had a seizure in a long time.   2. Avoid activities in which a seizure would cause danger to yourself or to others.  Don't operate dangerous machinery, swim alone, or climb in high or dangerous places, such as on ladders, roofs, or girders.  Do not drive unless your doctor says you may.  3. If you have any warning that you may have a seizure, lay down in a safe place where you can't hurt yourself.    4.  No driving for 6 months from last seizure, as per [facility].   Please refer to the following link on the [employer]'s website for more information: http://www.epilepsyfoundation.org/answerplace/Social/driving/drivingu.cfm   5.  Maintain good sleep hygiene. Avoid alcohol  6.  Contact your doctor if you have any problems that may be related to the medicine you are taking.  7.  Call 911 and bring the patient back to the ED if:        A.  The seizure lasts longer than 5 minutes.       B.  The patient doesn't awaken shortly after the seizure  C.  The patient has new problems such as difficulty seeing, speaking or moving  D.  The patient was injured during the seizure  E.  The patient has a temperature over 102 F (39C)  F.  The patient vomited and now is having trouble breathing
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
## 2023-05-31 ENCOUNTER — Telehealth: Payer: Self-pay | Admitting: Neurology

## 2023-05-31 NOTE — Telephone Encounter (Signed)
Left  a message on the VM  wanting to speak to someone about  letter for her Lawyer

## 2023-05-31 NOTE — Telephone Encounter (Signed)
Pt called back in wanting to make sure the information was sent to her lawyer from when she talked to her at her last visit. Her hearing is 06/08/23.

## 2023-06-01 NOTE — Telephone Encounter (Signed)
Done, pls fax and keep copy, thanks!

## 2023-06-01 NOTE — Telephone Encounter (Signed)
Faxed. Called patient and informed her. Patient stated her lawyer needed the paperwork by tomorrow and I informed patient has been sent. Patient will let us know if Lawyer did no receive. Patient thanked [place] for the call.

## 2023-07-03 ENCOUNTER — Other Ambulatory Visit: Payer: Self-pay | Admitting: Critical Care Medicine

## 2023-07-03 DIAGNOSIS — E782 Mixed hyperlipidemia: Secondary | ICD-10-CM

## 2023-07-03 DIAGNOSIS — I10 Essential (primary) hypertension: Secondary | ICD-10-CM

## 2023-07-05 ENCOUNTER — Other Ambulatory Visit: Payer: Self-pay

## 2023-07-05 NOTE — Telephone Encounter (Signed)
 Requested medication (s) are due for refill today: Yes  Requested medication (s) are on the active medication list: Yes  Last refill:  12/01/22 #90, 1 refill (all 3 meds)  Future visit scheduled: No  Notes to clinic:  Unable to refill per protocol due to failed labs, no updated results. Unable to refill per protocol, appointment needed.      Requested Prescriptions  Pending Prescriptions Disp Refills   hydrochlorothiazide (HYDRODIURIL) 25 MG tablet 90 tablet 1    Sig: Take 1 tablet (25 mg total) by mouth daily.     Cardiovascular: Diuretics - Thiazide Failed - 07/05/2023  4:19 PM      Failed - Cr in normal range and within 180 days    Creatinine, Ser  Date Value Ref Range Status  12/01/2022 0.61 0.57 - 1.00 mg/dL Final         Failed - K in normal range and within 180 days    Potassium  Date Value Ref Range Status  12/01/2022 4.1 3.5 - 5.2 mmol/L Final         Failed - Na in normal range and within 180 days    Sodium  Date Value Ref Range Status  12/01/2022 137 134 - 144 mmol/L Final         Failed - Valid encounter within last 6 months    Recent Outpatient Visits           7 months ago Essential hypertension   [facility] Dept Of [place]. [facility] Storm Frisk, MD   1 year ago Essential hypertension   [facility] Dept Of [place]. [facility], Marzella Schlein, [place]   1 year ago Essential hypertension   [facility] Dept Of [place]. [facility] Marcine Matar, MD   2 years ago Primary optic atrophy of right eye   [facility] - A Dept Of [place]. [facility] Mayers, Cari S, [place]   2 years ago Essential hypertension   [facility] [place] - [facility] Dept Of [place]. [facility] Jonah Blue B, MD              Passed - Last BP in normal range    BP Readings from Last 1 Encounters:  05/21/23 132/87           atorvastatin (LIPITOR) 10 MG tablet 90 tablet 1    Sig: Take 1 tablet (10 mg total) by mouth daily.     Cardiovascular:  Antilipid - Statins Failed - 07/05/2023  4:19 PM      Failed - Lipid Panel in normal range within the last 12 months    Cholesterol, Total  Date Value Ref Range Status  05/28/2022 195 100 - 199 mg/dL Final   LDL Chol Calc (NIH)  Date Value Ref Range Status  05/28/2022 121 (H) 0 - 99 mg/dL Final   HDL  Date Value Ref Range Status  05/28/2022 51 >39 mg/dL Final   Triglycerides  Date Value Ref Range Status  05/28/2022 128 0 - 149 mg/dL Final         Passed - Patient is not pregnant      Passed - Valid encounter within last 12 months    Recent Outpatient Visits           7 months ago Essential hypertension  [facility] [place] [facility] Dept Of [place]. [facility] Storm Frisk, MD   1 year ago Essential hypertension   [facility] [place] - [facility] Dept Of [place]. [facility] [place], Marzella Schlein, [place]   1 year ago Essential hypertension   [facility] [place] - [facility] Dept Of [place]. [facility] Marcine Matar, [place]   2 years ago Primary optic atrophy of right eye   [facility] - A Dept Of [place]. [facility] Mayers, Cari S, [place]   2 years ago Essential hypertension   [facility] [place] - [facility] Dept Of [place]. [facility] Jonah Blue B, MD               amLODipine (NORVASC) 10 MG tablet 90 tablet 1    Sig: Take 1 tablet (10 mg total) by mouth daily.     Cardiovascular: Calcium Channel Blockers 2 Failed - 07/05/2023  4:19 PM      Failed - Valid encounter within last 6 months    Recent Outpatient Visits           7 months ago Essential hypertension   [facility] [place] [facility] Dept Of [place]. [facility] Storm Frisk, [place]   1 year ago Essential hypertension   [facility] [place] - [facility] Dept  Of [place]. [facility] [place], Marzella Schlein, [place]   1 year ago Essential hypertension   [facility] - [facility] Dept Of [place]. [facility] Marcine Matar, [place]   2 years ago Primary optic atrophy of right eye   [facility] - A Dept Of [place]. [facility] Mayers, Cari S, [place]   2 years ago Essential hypertension   [facility] [place] - [facility] Dept Of [place]. [facility] Jonah Blue B, MD              Passed - Last BP in normal range    BP Readings from Last 1 Encounters:  05/21/23 132/87         Passed - Last Heart Rate in normal range    Pulse Readings from Last 1 Encounters:  05/21/23 77

## 2023-07-07 ENCOUNTER — Other Ambulatory Visit: Payer: Self-pay

## 2023-07-12 ENCOUNTER — Other Ambulatory Visit: Payer: Self-pay

## 2023-07-13 ENCOUNTER — Encounter: Payer: Self-pay | Admitting: Internal Medicine

## 2023-07-13 ENCOUNTER — Other Ambulatory Visit: Payer: Self-pay

## 2023-07-13 ENCOUNTER — Ambulatory Visit: Payer: MEDICAID | Attending: Internal Medicine | Admitting: Internal Medicine

## 2023-07-13 VITALS — BP 120/80 | HR 82 | Temp 98.2°F | Ht 60.0 in | Wt 184.0 lb

## 2023-07-13 DIAGNOSIS — F17200 Nicotine dependence, unspecified, uncomplicated: Secondary | ICD-10-CM

## 2023-07-13 DIAGNOSIS — Z23 Encounter for immunization: Secondary | ICD-10-CM | POA: Diagnosis not present

## 2023-07-13 DIAGNOSIS — E782 Mixed hyperlipidemia: Secondary | ICD-10-CM

## 2023-07-13 DIAGNOSIS — F17210 Nicotine dependence, cigarettes, uncomplicated: Secondary | ICD-10-CM | POA: Diagnosis not present

## 2023-07-13 DIAGNOSIS — Z6835 Body mass index (BMI) 35.0-35.9, adult: Secondary | ICD-10-CM

## 2023-07-13 DIAGNOSIS — I10 Essential (primary) hypertension: Secondary | ICD-10-CM | POA: Diagnosis not present

## 2023-07-13 DIAGNOSIS — R7303 Prediabetes: Secondary | ICD-10-CM | POA: Diagnosis not present

## 2023-07-13 DIAGNOSIS — Z1231 Encounter for screening mammogram for malignant neoplasm of breast: Secondary | ICD-10-CM

## 2023-07-13 DIAGNOSIS — E66812 Obesity, class 2: Secondary | ICD-10-CM

## 2023-07-13 LAB — POCT GLYCOSYLATED HEMOGLOBIN (HGB A1C): HbA1c, POC (prediabetic range): 5.7 % (ref 5.7–6.4)

## 2023-07-13 LAB — GLUCOSE, POCT (MANUAL RESULT ENTRY): POC Glucose: 100 mg/dL — AB (ref 70–99)

## 2023-07-13 MED ORDER — ATORVASTATIN CALCIUM 10 MG PO TABS
10.0000 mg | ORAL_TABLET | Freq: Every day | ORAL | 1 refills | Status: DC
  Filled 2023-07-13: qty 90, 90d supply, fill #0

## 2023-07-13 MED ORDER — AMLODIPINE BESYLATE 10 MG PO TABS
10.0000 mg | ORAL_TABLET | Freq: Every day | ORAL | 1 refills | Status: DC
  Filled 2023-07-13: qty 90, 90d supply, fill #0
  Filled 2023-10-11: qty 90, 90d supply, fill #1

## 2023-07-13 MED ORDER — HYDROCHLOROTHIAZIDE 25 MG PO TABS
25.0000 mg | ORAL_TABLET | Freq: Every day | ORAL | 1 refills | Status: DC
  Filled 2023-07-13: qty 90, 90d supply, fill #0
  Filled 2023-10-11: qty 90, 90d supply, fill #1

## 2023-07-13 MED ORDER — LOSARTAN POTASSIUM 25 MG PO TABS
25.0000 mg | ORAL_TABLET | Freq: Every day | ORAL | 1 refills | Status: DC
  Filled 2023-07-13 – 2023-10-11 (×2): qty 90, 90d supply, fill #0
  Filled 2024-01-07: qty 90, 90d supply, fill #1

## 2023-07-13 NOTE — Progress Notes (Signed)
 Patient ID: Marie Ferguson, female    DOB: [birth year]  MRN: [id number]  CC: Hypertension (HTN & prediabetes f/u. Med refill. /No questions / concerns/Discuss to flu & Tdap vax )   Subjective: Marie Ferguson is a 58 y.o. female who presents for chronic ds management. Her concerns today include:  HTN, HL, hypertensive retinopathy BL with optic disc atrophy on RT (Dr. Lavona Mound), obesity, tob dep.   I last saw her April 2023.  Last seen 11/2022 by Dr. Delford Field.  HTN: Should be on amlodipine 10 mg daily, Cozaar 25 mg daily and HCTZ 25 mg daily.  Taking medications as prescribed and took already for the morning.  Has device but needs new batteries. Reports a nurse from [facility] comes by her apartment building every Wednesday.  She gets BP, wgh and BS check.  Recent readings: 125/81, 122/75. -Limit salt in foods. -No chest pains or shortness of breath.  Tob dep: had stopped for 3 wks; restarted started after her son brought some home.  Currently smoking about 4 cigarettes a day.  Wants to quit. Has patches at home; uses but not consistently Completely stopped drinking beer  HL: Should be on atorvastatin 10 mg daily.  Reports taking.  Last LDL was 121 in January 2024  Seizure disorder: She continues to be followed by Dr. Karel Jarvis.  She is on Topamax and Oxcarbazepine. Also on Diazepam to use PRN.  No recent sz Trying to apply for disability.  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  Obesity/PreDM:  Results for orders placed or performed in visit on 07/13/23  POCT glucose (manual entry)   Collection Time: 07/13/23  8:47 AM  Result Value Ref Range   POC Glucose 100 (A) 70 - 99 mg/dl  POCT glycosylated hemoglobin (Hb A1C)   Collection Time: 07/13/23  8:52 AM  Result Value Ref Range   Hemoglobin A1C     HbA1c POC (<> result, manual entry)     HbA1c, POC (prediabetic range) 5.7 5.7 - 6.4 %   HbA1c, POC (controlled diabetic range)    Does not cook. Does a lot of frozen  meals. Lives alone. Sometimes her children come to visit and will cook for her. Not getting in any exercise. 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   Seizure disorder (HCC) 08/11/2021   Prediabetes 07/30/2019   Intraductal papilloma of left breast 06/22/2019   Screening breast examination 12/29/2018   Hyperlipidemia, mixed 12/08/2018   Hypertensive retinopathy of both eyes 11/07/2018   Essential hypertension 11/07/2018   Primary optic atrophy of right eye 11/07/2018   Tobacco dependence 11/07/2018   Obesity (BMI 30-39.9) 11/07/2018   Breast mass in female 11/07/2018     Current Outpatient Medications on File Prior to Visit  Medication Sig Dispense Refill   acetaminophen (TYLENOL) 325 MG tablet Take 650 mg by mouth every 6 (six) hours as needed.     diazePAM, 20 MG Dose, (VALTOCO 20 MG DOSE) 2 x 10 MG/0.1ML LQPK Administer one spray in one nostril, another spray in other nostril (one dose) as needed for seizure. May use second dose after 4 hours if needed 5 each 5   nicotine (NICODERM CQ - DOSED IN MG/24 HOURS) 14 mg/24hr patch Place 1 patch (14 mg total) onto the skin daily. 28 patch 2  Oxcarbazepine (TRILEPTAL) 300 MG tablet Take 1 tablet (300 mg total) by mouth 2 (two) times daily. 180 tablet 3   topiramate (TOPAMAX) 200 MG tablet Take 1 tablet (200 mg total) by mouth 2 (two) times daily. 180 tablet 3   No current facility-administered medications on file prior to visit.    No Known Allergies  Social History   Socioeconomic History   Marital status: Single    Spouse name: Not on file   Number of children: 3   Years of education: 1 yr community college   Highest education level: Some college, no degree  Occupational History   Occupation: [employer]  Tobacco Use   Smoking status: Former    Current packs/day: 0.00    Types: Cigarettes    Quit date:  03/2023    Years since quitting: 0.3   Smokeless tobacco: Never   Tobacco comments:    5 cigs/ day  Vaping Use   Vaping status: Never Used  Substance and Sexual Activity   Alcohol use: Yes    Comment: rarely   Drug use: Yes    Types: Marijuana    Comment: 1x a week   Sexual activity: Not Currently  Other Topics Concern   Not on file  Social History Narrative   Right handed    [place]    Social Drivers of Health   Financial Resource Strain: Not on file  Food Insecurity: Not on file  Transportation Needs: Unmet Transportation Needs (12/29/2018)   PRAPARE - Administrator, Civil Service (Medical): Yes    Lack of Transportation (Non-Medical): Yes  Physical Activity: Not on file  Stress: Not on file  Social Connections: Not on file  Intimate Partner Violence: Not on file    Family History  Problem Relation Age of Onset   Hypertension Mother    Hypertension Father    Heart attack Brother    Diabetes Maternal Aunt        x2   Diabetes Maternal Uncle     Past Surgical History:  Procedure Laterality Date   ABDOMINAL HYSTERECTOMY     BREAST LUMPECTOMY WITH RADIOACTIVE SEED LOCALIZATION Left 03/03/2019   Procedure: LEFT BREAST LUMPECTOMY X 2 WITH 2 RADIOACTIVE SEED LOCALIZATION;  Surgeon: Chevis Pretty III, MD;  Location: [facility];  Service: General;  Laterality: Left;   CESAREAN SECTION     x 3   COLONOSCOPY  07/21/2019   POLYPECTOMY  07/21/2019    ROS: Review of Systems Negative except as stated above  PHYSICAL EXAM: BP 120/80   Pulse 82   Temp 98.2 F (36.8 C) (Oral)   Ht 5' (1.524 m)   Wt 184 lb (83.5 kg)   SpO2 99%   BMI 35.94 kg/m   Wt Readings from Last 3 Encounters:  07/13/23 184 lb (83.5 kg)  05/21/23 189 lb 9.6 oz (86 kg)  02/05/23 186 lb 9.6 oz (84.6 kg)    Physical Exam  General appearance - alert, well appearing, and in no distress Mental status - normal mood, behavior, speech, dress, motor  activity, and thought processes Mouth -tongue is slightly dry. Neck - supple, no significant adenopathy Chest - clear to auscultation, no wheezes, rales or rhonchi, symmetric air entry Heart - normal rate, regular rhythm, normal S1, S2, no murmurs, rubs, clicks or gallops Extremities - peripheral pulses normal, no pedal edema, no clubbing or cyanosis      Latest Ref Rng & Units 12/01/2022  9:09 AM 10/31/2022   11:12 PM 10/31/2022    7:29 PM  CMP  Glucose 70 - 99 mg/dL 90   99   BUN 6 - 24 mg/dL 14   15   Creatinine 4.09 - 1.00 mg/dL 8.11   9.14   Sodium 782 - 144 mmol/L 137   130   Potassium 3.5 - 5.2 mmol/L 4.1  3.6  2.5   Chloride 96 - 106 mmol/L 104   97   CO2 20 - 29 mmol/L 19   16   Calcium 8.7 - 10.2 mg/dL 9.1   8.9   Total Protein 6.5 - 8.1 g/dL   8.5   Total Bilirubin 0.3 - 1.2 mg/dL   0.4   Alkaline Phos 38 - 126 U/L   126   AST 15 - 41 U/L   12   ALT 0 - 44 U/L   11    Lipid Panel     Component Value Date/Time   CHOL 195 05/28/2022 1047   TRIG 128 05/28/2022 1047   HDL 51 05/28/2022 1047   CHOLHDL 3.8 05/28/2022 1047   LDLCALC 121 (H) 05/28/2022 1047    CBC    Component Value Date/Time   WBC 7.7 10/31/2022 1929   RBC 4.34 10/31/2022 1929   HGB 12.2 10/31/2022 1929   HGB 13.0 02/11/2021 1108   HCT 36.3 10/31/2022 1929   HCT 38.1 02/11/2021 1108   PLT 414 (H) 10/31/2022 1929   PLT 382 02/11/2021 1108   MCV 83.6 10/31/2022 1929   MCV 86 02/11/2021 1108   MCH 28.1 10/31/2022 1929   MCHC 33.6 10/31/2022 1929   RDW 13.2 10/31/2022 1929   RDW 12.8 02/11/2021 1108   LYMPHSABS 0.8 09/09/2022 0002   LYMPHSABS 1.8 02/11/2021 1108   MONOABS 0.9 09/09/2022 0002   EOSABS 0.0 09/09/2022 0002   EOSABS 0.1 02/11/2021 1108   BASOSABS 0.0 09/09/2022 0002   BASOSABS 0.0 02/11/2021 1108    ASSESSMENT AND PLAN: 1. Essential hypertension (Primary) At goal. Continue current meds - amLODipine (NORVASC) 10 MG tablet; Take 1 tablet (10 mg total) by mouth daily.   Dispense: 90 tablet; Refill: 1 - hydrochlorothiazide (HYDRODIURIL) 25 MG tablet; Take 1 tablet (25 mg total) by mouth daily.  Dispense: 90 tablet; Refill: 1 - losartan (COZAAR) 25 MG tablet; Take 1 tablet (25 mg total) by mouth daily.  Dispense: 90 tablet; Refill: 1  2. Hyperlipidemia, mixed Continue Lipitor.  Check Lipid profile today - atorvastatin (LIPITOR) 10 MG tablet; Take 1 tablet (10 mg total) by mouth daily.  Dispense: 90 tablet; Refill: 1 - Lipid panel  3. Prediabetes Encourage meal planning. Encouraged her to try to walk around her apartment complex 2-3 times a week for 20 minutes. - POCT glycosylated hemoglobin (Hb A1C) - POCT glucose (manual entry)  4. Tobacco dependence Encouraged her to quit.  Encouraged her to use the nicotine patches consistently  5. Class 2 severe obesity due to excess calories with serious comorbidity and body mass index (BMI) of 35.0 to 35.9 in adult [facility]) See # 3 above  6. Encounter for screening mammogram for malignant neoplasm of breast - MM 3D SCREENING MAMMOGRAM BILATERAL BREAST; Future  7. Need for influenza vaccination Given flu shot today  8. Need for shingles vaccine Agreeable to receiving Shingrix.  Advised vaccine can cause redness and swelling at inj site.     Patient was given the opportunity to ask questions.  Patient verbalized understanding of the plan and  was able to repeat key elements of the plan.   This documentation was completed using Paediatric nurse.  Any transcriptional errors are unintentional.  Orders Placed This Encounter  Procedures   MM 3D SCREENING MAMMOGRAM BILATERAL BREAST   Varicella-zoster vaccine IM   Flu vaccine trivalent PF, 6mos and older(Flulaval,Afluria,Fluarix,Fluzone)   Lipid panel   POCT glycosylated hemoglobin (Hb A1C)   POCT glucose (manual entry)     Requested Prescriptions   Signed Prescriptions Disp Refills   amLODipine (NORVASC) 10 MG tablet 90 tablet 1    Sig:  Take 1 tablet (10 mg total) by mouth daily.   atorvastatin (LIPITOR) 10 MG tablet 90 tablet 1    Sig: Take 1 tablet (10 mg total) by mouth daily.   hydrochlorothiazide (HYDRODIURIL) 25 MG tablet 90 tablet 1    Sig: Take 1 tablet (25 mg total) by mouth daily.   losartan (COZAAR) 25 MG tablet 90 tablet 1    Sig: Take 1 tablet (25 mg total) by mouth daily.    Return in about 4 months (around 11/12/2023).  Jonah Blue, MD, FACP

## 2023-07-13 NOTE — Patient Instructions (Signed)
 I encourage you to walk for 15 to 20 minutes at least 3 times a week around your apartment complex.  Try using the nicotine patches consistently every day as discussed to help you quit smoking.  Healthy Eating, Adult Healthy eating may help you get and keep a healthy body weight, reduce the risk of chronic disease, and live a long and productive life. It is important to follow a healthy eating pattern. Your nutritional and calorie needs should be met mainly by different nutrient-rich foods. What are tips for following this plan? Reading food labels Read labels and choose the following: Reduced or low sodium products. Juices with 100% fruit juice. Foods with low saturated fats (<3 g per serving) and high polyunsaturated and monounsaturated fats. Foods with whole grains, such as whole wheat, cracked wheat, brown rice, and wild rice. Whole grains that are fortified with folic acid. This is recommended for females who are pregnant or who want to become pregnant. Read labels and do not eat or drink the following: Foods or drinks with added sugars. These include foods that contain brown sugar, corn sweetener, corn syrup, dextrose, fructose, glucose, high-fructose corn syrup, honey, invert sugar, lactose, malt syrup, maltose, molasses, raw sugar, sucrose, trehalose, or turbinado sugar. Limit your intake of added sugars to less than 10% of your total daily calories. Do not eat more than the following amounts of added sugar per day: 6 teaspoons (25 g) for females. 9 teaspoons (38 g) for males. Foods that contain processed or refined starches and grains. Refined grain products, such as white flour, degermed cornmeal, white bread, and white rice. Shopping Choose nutrient-rich snacks, such as vegetables, whole fruits, and nuts. Avoid high-calorie and high-sugar snacks, such as potato chips, fruit snacks, and candy. Use oil-based dressings and spreads on foods instead of solid fats such as butter,  margarine, sour cream, or cream cheese. Limit pre-made sauces, mixes, and "instant" products such as flavored rice, instant noodles, and ready-made pasta. Try more plant-protein sources, such as tofu, tempeh, black beans, edamame, lentils, nuts, and seeds. Explore eating plans such as the Mediterranean diet or vegetarian diet. Try heart-healthy dips made with beans and healthy fats like hummus and guacamole. Vegetables go great with these. Cooking Use oil to saut or stir-fry foods instead of solid fats such as butter, margarine, or lard. Try baking, boiling, grilling, or broiling instead of frying. Remove the fatty part of meats before cooking. Steam vegetables in water or broth. Meal planning  At meals, imagine dividing your plate into fourths: One-half of your plate is fruits and vegetables. One-fourth of your plate is whole grains. One-fourth of your plate is protein, especially lean meats, poultry, eggs, tofu, beans, or nuts. Include low-fat dairy as part of your daily diet. Lifestyle Choose healthy options in all settings, including home, work, school, restaurants, or stores. Prepare your food safely: Wash your hands after handling raw meats. Where you prepare food, keep surfaces clean by regularly washing with hot, soapy water. Keep raw meats separate from ready-to-eat foods, such as fruits and vegetables. Cook seafood, meat, poultry, and eggs to the recommended temperature. Get a food thermometer. Store foods at safe temperatures. In general: Keep cold foods at 85F (4.4C) or below. Keep hot foods at 185F (60C) or above. Keep your freezer at [facility] (-17.8C) or below. Foods are not safe to eat if they have been between the temperatures of 40-185F (4.4-60C) for more than 2 hours. What foods should I eat? Fruits Aim to eat 1-2 cups  of fresh, canned (in natural juice), or frozen fruits each day. One cup of fruit equals 1 small apple, 1 large banana, 8 large strawberries, 1 cup  (237 g) canned fruit,  cup (82 g) dried fruit, or 1 cup (240 mL) 100% juice. Vegetables Aim to eat 2-4 cups of fresh and frozen vegetables each day, including different varieties and colors. One cup of vegetables equals 1 cup (91 g) broccoli or cauliflower florets, 2 medium carrots, 2 cups (150 g) raw, leafy greens, 1 large tomato, 1 large bell pepper, 1 large sweet potato, or 1 medium white potato. Grains Aim to eat 5-10 ounce-equivalents of whole grains each day. Examples of 1 ounce-equivalent of grains include 1 slice of bread, 1 cup (40 g) ready-to-eat cereal, 3 cups (24 g) popcorn, or  cup (93 g) cooked rice. Meats and other proteins Try to eat 5-7 ounce-equivalents of protein each day. Examples of 1 ounce-equivalent of protein include 1 egg,  oz nuts (12 almonds, 24 pistachios, or 7 walnut halves), 1/4 cup (90 g) cooked beans, 6 tablespoons (90 g) hummus or 1 tablespoon (16 g) peanut butter. A cut of meat or fish that is the size of a deck of cards is about 3-4 ounce-equivalents (85 g). Of the protein you eat each week, try to have at least 8 sounce (227 g) of seafood. This is about 2 servings per week. This includes salmon, trout, herring, sardines, and anchovies. Dairy Aim to eat 3 cup-equivalents of fat-free or low-fat dairy each day. Examples of 1 cup-equivalent of dairy include 1 cup (240 mL) milk, 8 ounces (250 g) yogurt, 1 ounces (44 g) natural cheese, or 1 cup (240 mL) fortified soy milk. Fats and oils Aim for about 5 teaspoons (21 g) of fats and oils per day. Choose monounsaturated fats, such as canola and olive oils, mayonnaise made with olive oil or avocado oil, avocados, peanut butter, and most nuts, or polyunsaturated fats, such as sunflower, corn, and soybean oils, walnuts, pine nuts, sesame seeds, sunflower seeds, and flaxseed. Beverages Aim for 6 eight-ounce glasses of water per day. Limit coffee to 3-5 eight-ounce cups per day. Limit caffeinated beverages that have added  calories, such as soda and energy drinks. If you drink alcohol: Limit how much you have to: 0-1 drink a day if you are female. 0-2 drinks a day if you are female. Know how much alcohol is in your drink. In the [place] one drink is one 12 oz bottle of beer (355 mL), one 5 oz glass of wine (148 mL), or one 1 oz glass of hard liquor (44 mL). Seasoning and other foods Try not to add too much salt to your food. Try using herbs and spices instead of salt. Try not to add sugar to food. This information is based on [place] nutrition guidelines. To learn more, visit DisposableN[employer].be. Exact amounts may vary. You may need different amounts. This information is not intended to replace advice given to you by your health care provider. Make sure you discuss any questions you have with your health care provider. Document Revised: 01/19/2022 Document Reviewed: 01/19/2022 Elsevier Patient Education  2024 [employer].

## 2023-07-14 ENCOUNTER — Other Ambulatory Visit: Payer: Self-pay

## 2023-07-14 ENCOUNTER — Encounter: Payer: Self-pay | Admitting: Internal Medicine

## 2023-07-14 ENCOUNTER — Other Ambulatory Visit: Payer: Self-pay | Admitting: Internal Medicine

## 2023-07-14 DIAGNOSIS — E782 Mixed hyperlipidemia: Secondary | ICD-10-CM

## 2023-07-14 LAB — LIPID PANEL
Chol/HDL Ratio: 3.5 ratio (ref 0.0–4.4)
Cholesterol, Total: 191 mg/dL (ref 100–199)
HDL: 54 mg/dL (ref >39)
LDL Chol Calc (NIH): 119 mg/dL — ABNORMAL HIGH (ref 0–99)
Triglycerides: 100 mg/dL (ref 0–149)
VLDL Cholesterol Cal: 18 mg/dL (ref 5–40)

## 2023-07-14 MED ORDER — ATORVASTATIN CALCIUM 20 MG PO TABS
20.0000 mg | ORAL_TABLET | Freq: Every day | ORAL | 1 refills | Status: DC
  Filled 2023-07-14: qty 90, 90d supply, fill #0
  Filled 2023-10-11: qty 90, 90d supply, fill #1

## 2023-07-19 ENCOUNTER — Other Ambulatory Visit: Payer: Self-pay

## 2023-07-20 ENCOUNTER — Telehealth: Payer: Self-pay | Admitting: Internal Medicine

## 2023-07-20 NOTE — Telephone Encounter (Signed)
 Call to patient- she has been taking 10 mg and now has to increase to 20 mg- patient advised she can take 2 10 mg until she is finished with her bottle and then switch to 20 mg. She states she had not even considered that option- thankful for the advice.     Copied from CRM [id number]. Topic: Clinical - Medical Advice >> Jul 20, 2023  9:15 AM Everette C wrote: Reason for CRM: The patient would like to be contacted by a member of clinical staff when possible to confirm the dose and directions of their prescription for Rx #: [id number]  atorvastatin (LIPITOR) 20 MG tablet [[id number]]   Please contact when possible

## 2023-07-20 NOTE — Telephone Encounter (Signed)
 Noted.

## 2023-07-30 ENCOUNTER — Ambulatory Visit: Payer: MEDICAID

## 2023-08-04 ENCOUNTER — Other Ambulatory Visit: Payer: Self-pay

## 2023-08-25 ENCOUNTER — Other Ambulatory Visit: Payer: Self-pay

## 2023-08-31 ENCOUNTER — Ambulatory Visit
Admission: RE | Admit: 2023-08-31 | Discharge: 2023-08-31 | Disposition: A | Discharge status: Home / Self Care | Payer: MEDICAID | Source: Ambulatory Visit | Attending: Internal Medicine | Admitting: Internal Medicine

## 2023-08-31 DIAGNOSIS — Z1231 Encounter for screening mammogram for malignant neoplasm of breast: Secondary | ICD-10-CM

## 2023-09-01 ENCOUNTER — Encounter: Payer: Self-pay | Admitting: Internal Medicine

## 2023-09-06 ENCOUNTER — Ambulatory Visit: Payer: MEDICAID | Admitting: Neurology

## 2023-10-11 ENCOUNTER — Other Ambulatory Visit: Payer: Self-pay

## 2023-10-12 ENCOUNTER — Other Ambulatory Visit: Payer: Self-pay

## 2023-11-12 ENCOUNTER — Ambulatory Visit: Payer: MEDICAID | Attending: Internal Medicine | Admitting: Internal Medicine

## 2023-11-12 ENCOUNTER — Encounter: Payer: Self-pay | Admitting: Internal Medicine

## 2023-11-12 ENCOUNTER — Other Ambulatory Visit: Payer: Self-pay

## 2023-11-12 VITALS — BP 102/71 | HR 87 | Temp 98.7°F | Ht 60.0 in | Wt 188.0 lb

## 2023-11-12 DIAGNOSIS — H35033 Hypertensive retinopathy, bilateral: Secondary | ICD-10-CM | POA: Diagnosis not present

## 2023-11-12 DIAGNOSIS — I10 Essential (primary) hypertension: Secondary | ICD-10-CM | POA: Insufficient documentation

## 2023-11-12 DIAGNOSIS — G40909 Epilepsy, unspecified, not intractable, without status epilepticus: Secondary | ICD-10-CM | POA: Diagnosis not present

## 2023-11-12 DIAGNOSIS — Z79899 Other long term (current) drug therapy: Secondary | ICD-10-CM | POA: Insufficient documentation

## 2023-11-12 DIAGNOSIS — E66812 Obesity, class 2: Secondary | ICD-10-CM | POA: Insufficient documentation

## 2023-11-12 DIAGNOSIS — R7303 Prediabetes: Secondary | ICD-10-CM | POA: Diagnosis not present

## 2023-11-12 DIAGNOSIS — Z6830 Body mass index (BMI) 30.0-30.9, adult: Secondary | ICD-10-CM | POA: Insufficient documentation

## 2023-11-12 DIAGNOSIS — E782 Mixed hyperlipidemia: Secondary | ICD-10-CM | POA: Insufficient documentation

## 2023-11-12 DIAGNOSIS — Z6836 Body mass index (BMI) 36.0-36.9, adult: Secondary | ICD-10-CM

## 2023-11-12 DIAGNOSIS — Z23 Encounter for immunization: Secondary | ICD-10-CM

## 2023-11-12 DIAGNOSIS — F17210 Nicotine dependence, cigarettes, uncomplicated: Secondary | ICD-10-CM

## 2023-11-12 DIAGNOSIS — F17200 Nicotine dependence, unspecified, uncomplicated: Secondary | ICD-10-CM

## 2023-11-12 MED ORDER — AMLODIPINE BESYLATE 10 MG PO TABS
10.0000 mg | ORAL_TABLET | Freq: Every day | ORAL | 1 refills | Status: DC
  Filled 2023-11-12 – 2024-01-07 (×2): qty 90, 90d supply, fill #0
  Filled 2024-04-04: qty 90, 90d supply, fill #1

## 2023-11-12 MED ORDER — ZOSTER VAC RECOMB ADJUVANTED 50 MCG/0.5ML IM SUSR
0.5000 mL | Freq: Once | INTRAMUSCULAR | 0 refills | Status: AC
  Filled 2023-11-12: qty 1, 1d supply, fill #0

## 2023-11-12 MED ORDER — HYDROCHLOROTHIAZIDE 25 MG PO TABS
25.0000 mg | ORAL_TABLET | Freq: Every day | ORAL | 1 refills | Status: DC
  Filled 2023-11-12 – 2024-01-07 (×2): qty 90, 90d supply, fill #0
  Filled 2024-04-04: qty 90, 90d supply, fill #1

## 2023-11-12 MED ORDER — ATORVASTATIN CALCIUM 20 MG PO TABS
20.0000 mg | ORAL_TABLET | Freq: Every day | ORAL | 1 refills | Status: DC
  Filled 2023-11-12 – 2024-02-24 (×3): qty 90, 90d supply, fill #0
  Filled 2024-04-04: qty 90, 90d supply, fill #1

## 2023-11-12 MED ORDER — ZOSTER VAC RECOMB ADJUVANTED 50 MCG/0.5ML IM SUSR
0.5000 mL | Freq: Once | INTRAMUSCULAR | 0 refills | Status: AC
Start: 2023-11-12 — End: 2023-11-12

## 2023-11-12 NOTE — Progress Notes (Signed)
 Patient ID: Marie Ferguson, female    DOB: 1966-04-26  MRN: [id number]  CC: Hypertension (HTN f/u. Thompson nicotine  patches & possible interactions with seizures meds/Discuss shingles vax)   Subjective: Marie Ferguson is a 58 y.o. female who presents for chronic ds management. Her concerns today include:  HTN, HL, hypertensive retinopathy BL with optic disc atrophy on RT (Dr. Fate), obesity, tob dep.   Discussed the use of AI scribe software for clinical note transcription with the patient, who gave verbal consent to proceed.  History of Present Illness   The patient presents for follow-up regarding hypertension, hyperlipidemia, and seizure disorder.  HTN: compliant with amlodipine  10 mg daily, hydrochlorothiazide  25 mg daily, and Cozaar  25 mg daily. She monitors her blood pressure at home daily and reports stable readings. Forgot to bring log with her today. She is also limiting her salt intake. No chest pain, shortness of breath, or leg swelling.  Tobacco dependence: She has a history of smoking four cigarettes a day and is hesitant to use nicotine  patches due to concerns about potential side effects, such as bad dreams. She would like to quit and wonders about whether other medications would be effective in helping her to quit.    Her seizure disorder is managed by a neurologist, and she is on Topamax , Trileptal  as her maintenance medications, and diazepam  as needed. She has not experienced any recent seizures and has not needed to use diazepam .  Obesity/prediabetes: She is addressing her prediabetes by working on her weight and engaging in regular physical activity, including walking for about 30 minutes in the afternoons when the weather permits. Her dietary focus includes drinking water, avoiding sugary drinks, and consuming plenty of vegetables. She is less enthusiastic about fruits but has recently tried organic bananas.  She is currently taking atorvastatin  20 mg daily and confirms  adherence to the increased dose.       Patient Active Problem List   Diagnosis Date Noted   Seizure disorder (HCC) 08/11/2021   Prediabetes 07/30/2019   Intraductal papilloma of left breast 06/22/2019   Screening breast examination 12/29/2018   Hyperlipidemia, mixed 12/08/2018   Hypertensive retinopathy of both eyes 11/07/2018   Essential hypertension 11/07/2018   Primary optic atrophy of right eye 11/07/2018   Tobacco dependence 11/07/2018   Obesity (BMI 30-39.9) 11/07/2018   Breast mass in female 11/07/2018     Current Outpatient Medications on File Prior to Visit  Medication Sig Dispense Refill   acetaminophen  (TYLENOL ) 325 MG tablet Take 650 mg by mouth every 6 (six) hours as needed.     losartan  (COZAAR ) 25 MG tablet Take 1 tablet (25 mg total) by mouth daily. 90 tablet 1   nicotine  (NICODERM CQ  - DOSED IN MG/24 HOURS) 14 mg/24hr patch Place 1 patch (14 mg total) onto the skin daily. 28 patch 2   Oxcarbazepine  (TRILEPTAL ) 300 MG tablet Take 1 tablet (300 mg total) by mouth 2 (two) times daily. 180 tablet 3   topiramate  (TOPAMAX ) 200 MG tablet Take 1 tablet (200 mg total) by mouth 2 (two) times daily. 180 tablet 3   diazePAM , 20 MG Dose, (VALTOCO  20 MG DOSE) 2 x 10 MG/0.1ML LQPK Administer one spray in one nostril, another spray in other nostril (one dose) as needed for seizure. May use second dose after 4 hours if needed (Patient not taking: Reported on 11/12/2023) 5 each 5   No current facility-administered medications on file prior to visit.    No Known Allergies  Social History   Socioeconomic History   Marital status: Single    Spouse name: Not on file   Number of children: 3   Years of education: 1 yr community college   Highest education level: Some college, no degree  Occupational History   Occupation: [employer]  Tobacco Use   Smoking status: Former    Current packs/day: 0.00    Types: Cigarettes    Quit date: 03/2023    Years since quitting: 0.6    Smokeless tobacco: Never   Tobacco comments:    5 cigs/ day  Vaping Use   Vaping status: Never Used  Substance and Sexual Activity   Alcohol use: Yes    Comment: rarely   Drug use: Yes    Types: Marijuana    Comment: 1x a week   Sexual activity: Not Currently  Other Topics Concern   Not on file  Social History Narrative   Right handed    [place]    Social Drivers of Health   Financial Resource Strain: Not on file  Food Insecurity: Not on file  Transportation Needs: Unmet Transportation Needs (12/29/2018)   PRAPARE - Administrator, Civil Service (Medical): Yes    Lack of Transportation (Non-Medical): Yes  Physical Activity: Not on file  Stress: Not on file  Social Connections: Not on file  Intimate Partner Violence: Not on file    Family History  Problem Relation Age of Onset   Hypertension Mother    Hypertension Father    Diabetes Maternal Aunt        x2   Diabetes Maternal Uncle    Heart attack Brother    BRCA 1/2 Neg Hx    Breast cancer Neg Hx     Past Surgical History:  Procedure Laterality Date   ABDOMINAL HYSTERECTOMY     BREAST LUMPECTOMY WITH RADIOACTIVE SEED LOCALIZATION Left 03/03/2019   Procedure: LEFT BREAST LUMPECTOMY X 2 WITH 2 RADIOACTIVE SEED LOCALIZATION;  Surgeon: Curvin Mt III, MD;  Location: [facility];  Service: General;  Laterality: Left;   CESAREAN SECTION     x 3   COLONOSCOPY  07/21/2019   POLYPECTOMY  07/21/2019    ROS: Review of Systems Negative except as stated above  PHYSICAL EXAM: BP 102/71 (BP Location: Left Arm, Patient Position: Sitting, Cuff Size: Normal)   Pulse 87   Temp 98.7 F (37.1 C) (Oral)   Ht 5' (1.524 m)   Wt 188 lb (85.3 kg)   SpO2 98%   BMI 36.72 kg/m   Wt Readings from Last 3 Encounters:  11/12/23 188 lb (85.3 kg)  07/13/23 184 lb (83.5 kg)  05/21/23 189 lb 9.6 oz (86 kg)    Physical Exam  General appearance - alert, well appearing, older  African-American female and in no distress Mental status - normal mood, behavior, speech, dress, motor activity, and thought processes Chest - clear to auscultation, no wheezes, rales or rhonchi, symmetric air entry Heart - normal rate, regular rhythm, normal S1, S2, no murmurs, rubs, clicks or gallops Extremities - peripheral pulses normal, no pedal edema, no clubbing or cyanosis      Latest Ref Rng & Units 12/01/2022    9:09 AM 10/31/2022   11:12 PM 10/31/2022    7:29 PM  CMP  Glucose 70 - 99 mg/dL 90   99   BUN 6 - 24 mg/dL 14   15   Creatinine 9.42 - 1.00 mg/dL  0.61   0.72   Sodium 134 - 144 mmol/L 137   130   Potassium 3.5 - 5.2 mmol/L 4.1  3.6  2.5   Chloride 96 - 106 mmol/L 104   97   CO2 20 - 29 mmol/L 19   16   Calcium  8.7 - 10.2 mg/dL 9.1   8.9   Total Protein 6.5 - 8.1 g/dL   8.5   Total Bilirubin 0.3 - 1.2 mg/dL   0.4   Alkaline Phos 38 - 126 U/L   126   AST 15 - 41 U/L   12   ALT 0 - 44 U/L   11    Lipid Panel     Component Value Date/Time   CHOL 191 07/13/2023 0949   TRIG 100 07/13/2023 0949   HDL 54 07/13/2023 0949   CHOLHDL 3.5 07/13/2023 0949   LDLCALC 119 (H) 07/13/2023 0949    CBC    Component Value Date/Time   WBC 7.7 10/31/2022 1929   RBC 4.34 10/31/2022 1929   HGB 12.2 10/31/2022 1929   HGB 13.0 02/11/2021 1108   HCT 36.3 10/31/2022 1929   HCT 38.1 02/11/2021 1108   PLT 414 (H) 10/31/2022 1929   PLT 382 02/11/2021 1108   MCV 83.6 10/31/2022 1929   MCV 86 02/11/2021 1108   MCH 28.1 10/31/2022 1929   MCHC 33.6 10/31/2022 1929   RDW 13.2 10/31/2022 1929   RDW 12.8 02/11/2021 1108   LYMPHSABS 0.8 09/09/2022 0002   LYMPHSABS 1.8 02/11/2021 1108   MONOABS 0.9 09/09/2022 0002   EOSABS 0.0 09/09/2022 0002   EOSABS 0.1 02/11/2021 1108   BASOSABS 0.0 09/09/2022 0002   BASOSABS 0.0 02/11/2021 1108    ASSESSMENT AND PLAN: 1. Essential hypertension (Primary) At goal.  Continue Norvasc  10 mg daily, HCTZ 25 mg daily and Cozaar  25 mg daily. -  amLODipine  (NORVASC ) 10 MG tablet; Take 1 tablet (10 mg total) by mouth daily.  Dispense: 90 tablet; Refill: 1 - hydrochlorothiazide  (HYDRODIURIL ) 25 MG tablet; Take 1 tablet (25 mg total) by mouth daily.  Dispense: 90 tablet; Refill: 1  2. Tobacco dependence Encouraged her to quit.  She is stating that she will desires to quit.  Encouraged her to try the nicotine  patches that she already have and if she develops any side effects from them, she can always stop.  She is concerned that it may cause bad dreams.  I have not had this reported as an issue from other patient's for whom I have prescribed nicotine  patches.  Advised that we can use Chantix but one of the stated side effects of Chantix is that it can cause mood swings and bad dreams.  She agrees to give the patches to try.  3. Seizure disorder (HCC) Stable on current medications.  Continue to follow-up with neurology.  4. Class 2 severe obesity due to excess calories with serious comorbidity and body mass index (BMI) of 36.0 to 36.9 in adult [facility]) Patient encouraged to incorporate fruits into her diet daily and aim for at least 3 servings a day.  Continue daily intake of vegetables.  Continue to limit sugary drink consumption.  Continue daily walking.    5. Hyperlipidemia, mixed LDL was not at goal on last visit so dose increase to 20 mg. Continue med - atorvastatin  (LIPITOR) 20 MG tablet; Take 1 tablet (20 mg total) by mouth daily.  Dispense: 90 tablet; Refill: 1  6. Need for shingles vaccine - Zoster Vaccine Adjuvanted (SHINGRIX )  injection; Inject 0.5 mLs into the muscle once for 1 dose.  Dispense: 0.5 mL; Refill: 0    Patient was given the opportunity to ask questions.  Patient verbalized understanding of the plan and was able to repeat key elements of the plan.   This documentation was completed using Paediatric nurse.  Any transcriptional errors are unintentional.  No orders of the defined types were placed in  this encounter.    Requested Prescriptions   Signed Prescriptions Disp Refills   amLODipine  (NORVASC ) 10 MG tablet 90 tablet 1    Sig: Take 1 tablet (10 mg total) by mouth daily.   hydrochlorothiazide  (HYDRODIURIL ) 25 MG tablet 90 tablet 1    Sig: Take 1 tablet (25 mg total) by mouth daily.   atorvastatin  (LIPITOR) 20 MG tablet 90 tablet 1    Sig: Take 1 tablet (20 mg total) by mouth daily.   Zoster Vaccine Adjuvanted (SHINGRIX ) injection 0.5 mL 0    Sig: Inject 0.5 mLs into the muscle once for 1 dose.    Return in about 6 weeks (around 12/24/2023) for Welcome TO medicare Visit .  Barnie Louder, MD, FACP

## 2023-11-12 NOTE — Patient Instructions (Addendum)
 VISIT SUMMARY:  You came in today for a follow-up regarding your hypertension, hyperlipidemia, and seizure disorder. We discussed your current medications and lifestyle habits, and reviewed your progress in managing these conditions. We also talked about your smoking habits and ways to quit, as well as general health maintenance including vaccinations.  YOUR PLAN:  -HYPERTENSION: Hypertension means high blood pressure. Your blood pressure is well-controlled with your current medications. Continue taking amlodipine  10 mg daily, hydrochlorothiazide  25 mg daily, and losartan  25 mg daily. We have refilled your prescription for amlodipine .  -SEIZURE DISORDER: A seizure disorder is a condition where you experience seizures. Your condition is well-managed with your current medications. Continue taking Topamax , Trileptal , and diazepam  as needed. Follow up with your neurologist as scheduled.  -PREDIABETES: Prediabetes means your blood sugar levels are higher than normal but not high enough to be classified as diabetes. Continue your regular walking for 30 minutes daily and try to increase your intake of fruits and vegetables.  -HYPERLIPIDEMIA: Hyperlipidemia means you have high levels of fats in your blood. You are managing this well with atorvastatin  20 mg daily. Continue taking this medication as prescribed. We have refilled your prescription for atorvastatin .  -TOBACCO USE DISORDER: Tobacco use disorder means you are dependent on smoking. You are currently smoking four cigarettes a day. We discussed using nicotine  patches and other alternatives like nicotine  gum and Chantix to help you quit smoking. Please try the patches and let me know if you develop any significant side effects or bad dreams.  -GENERAL HEALTH MAINTENANCE: We discussed the importance of staying up-to-date with your vaccinations. You are due for the second shingles vaccine, which you should obtain from the pharmacy today. We also talked  about getting a tetanus booster and the hepatitis B vaccine series in future visits.  INSTRUCTIONS:  Please obtain your second shingles vaccine from the pharmacy today. Schedule your Medicare Welcome to Medicare visit in six weeks. Continue following up with your neurologist for your seizure disorder.

## 2023-11-18 ENCOUNTER — Other Ambulatory Visit: Payer: Self-pay

## 2023-11-29 ENCOUNTER — Other Ambulatory Visit: Payer: Self-pay

## 2023-12-17 ENCOUNTER — Other Ambulatory Visit: Payer: Self-pay

## 2023-12-17 ENCOUNTER — Emergency Department (HOSPITAL_COMMUNITY)
Admission: EM | Admit: 2023-12-17 | Discharge: 2023-12-17 | Disposition: A | Discharge status: Skilled Nursing Facility | Attending: Emergency Medicine | Admitting: Emergency Medicine

## 2023-12-17 DIAGNOSIS — Z79899 Other long term (current) drug therapy: Secondary | ICD-10-CM | POA: Insufficient documentation

## 2023-12-17 DIAGNOSIS — I10 Essential (primary) hypertension: Secondary | ICD-10-CM | POA: Insufficient documentation

## 2023-12-17 DIAGNOSIS — E871 Hypo-osmolality and hyponatremia: Secondary | ICD-10-CM | POA: Diagnosis not present

## 2023-12-17 DIAGNOSIS — R569 Unspecified convulsions: Secondary | ICD-10-CM | POA: Diagnosis present

## 2023-12-17 LAB — CBC WITH DIFFERENTIAL/PLATELET
Abs Immature Granulocytes: 0.01 K/uL (ref 0.00–0.07)
Basophils Absolute: 0 K/uL (ref 0.0–0.1)
Basophils Relative: 1 %
Eosinophils Absolute: 0.1 K/uL (ref 0.0–0.5)
Eosinophils Relative: 3 %
HCT: 35.4 % — ABNORMAL LOW (ref 36.0–46.0)
Hemoglobin: 11.6 g/dL — ABNORMAL LOW (ref 12.0–15.0)
Immature Granulocytes: 0 %
Lymphocytes Relative: 31 %
Lymphs Abs: 1.2 K/uL (ref 0.7–4.0)
MCH: 27.7 pg (ref 26.0–34.0)
MCHC: 32.8 g/dL (ref 30.0–36.0)
MCV: 84.5 fL (ref 80.0–100.0)
Monocytes Absolute: 0.4 K/uL (ref 0.1–1.0)
Monocytes Relative: 12 %
Neutro Abs: 2 K/uL (ref 1.7–7.7)
Neutrophils Relative %: 53 %
Platelets: 335 K/uL (ref 150–400)
RBC: 4.19 MIL/uL (ref 3.87–5.11)
RDW: 13.7 % (ref 11.5–15.5)
WBC: 3.7 K/uL — ABNORMAL LOW (ref 4.0–10.5)
nRBC: 0 % (ref 0.0–0.2)

## 2023-12-17 LAB — COMPREHENSIVE METABOLIC PANEL WITH GFR
ALT: 10 U/L (ref 0–44)
AST: 14 U/L — ABNORMAL LOW (ref 15–41)
Albumin: 3.4 g/dL — ABNORMAL LOW (ref 3.5–5.0)
Alkaline Phosphatase: 147 U/L — ABNORMAL HIGH (ref 38–126)
Anion gap: 9 (ref 5–15)
BUN: 19 mg/dL (ref 6–20)
CO2: 25 mmol/L (ref 22–32)
Calcium: 9.1 mg/dL (ref 8.9–10.3)
Chloride: 94 mmol/L — ABNORMAL LOW (ref 98–111)
Creatinine, Ser: 0.63 mg/dL (ref 0.44–1.00)
GFR, Estimated: >60 mL/min (ref >60)
Glucose, Bld: 94 mg/dL (ref 70–99)
Potassium: 3.3 mmol/L — ABNORMAL LOW (ref 3.5–5.1)
Sodium: 128 mmol/L — ABNORMAL LOW (ref 135–145)
Total Bilirubin: 0.3 mg/dL (ref 0.0–1.2)
Total Protein: 7.9 g/dL (ref 6.5–8.1)

## 2023-12-17 MED ORDER — SODIUM CHLORIDE 0.9 % IV BOLUS
1000.0000 mL | Freq: Once | INTRAVENOUS | Status: AC
  Administered 2023-12-17: 1000 mL via INTRAVENOUS

## 2023-12-17 NOTE — ED Notes (Signed)
 RN assisted patient to bathroom, patient was able to ambulate without difficulty, denies SOB or dizziness, gait steady.
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
## 2023-12-17 NOTE — Discharge Instructions (Signed)
 Continue your current medications.  Follow-up with your neurologist to be rechecked

## 2024-01-04 ENCOUNTER — Encounter: Admitting: Internal Medicine

## 2024-01-07 ENCOUNTER — Telehealth: Payer: Self-pay | Admitting: Internal Medicine

## 2024-01-07 NOTE — Telephone Encounter (Signed)
 Called patient to confirm upcoming appointment 01/10/2024. Patient appointment has been successfully confirmed

## 2024-01-08 ENCOUNTER — Other Ambulatory Visit: Payer: Self-pay

## 2024-01-10 ENCOUNTER — Encounter: Payer: Self-pay | Admitting: Internal Medicine

## 2024-01-10 ENCOUNTER — Encounter: Payer: Self-pay | Admitting: Neurology

## 2024-01-10 ENCOUNTER — Other Ambulatory Visit: Payer: Self-pay

## 2024-01-10 ENCOUNTER — Other Ambulatory Visit

## 2024-01-10 ENCOUNTER — Ambulatory Visit: Payer: MEDICAID | Admitting: Neurology

## 2024-01-10 ENCOUNTER — Ambulatory Visit: Attending: Internal Medicine | Admitting: Internal Medicine

## 2024-01-10 VITALS — BP 124/80 | HR 86 | Temp 98.2°F | Ht 59.0 in | Wt 194.2 lb

## 2024-01-10 VITALS — BP 153/90 | HR 77 | Ht 59.0 in | Wt 195.0 lb

## 2024-01-10 DIAGNOSIS — E871 Hypo-osmolality and hyponatremia: Secondary | ICD-10-CM | POA: Diagnosis not present

## 2024-01-10 DIAGNOSIS — H538 Other visual disturbances: Secondary | ICD-10-CM

## 2024-01-10 DIAGNOSIS — Z0000 Encounter for general adult medical examination without abnormal findings: Secondary | ICD-10-CM | POA: Diagnosis not present

## 2024-01-10 DIAGNOSIS — G40009 Localization-related (focal) (partial) idiopathic epilepsy and epileptic syndromes with seizures of localized onset, not intractable, without status epilepticus: Secondary | ICD-10-CM | POA: Diagnosis not present

## 2024-01-10 DIAGNOSIS — I10 Essential (primary) hypertension: Secondary | ICD-10-CM

## 2024-01-10 DIAGNOSIS — F17200 Nicotine dependence, unspecified, uncomplicated: Secondary | ICD-10-CM | POA: Diagnosis not present

## 2024-01-10 DIAGNOSIS — Z23 Encounter for immunization: Secondary | ICD-10-CM

## 2024-01-10 DIAGNOSIS — Z1331 Encounter for screening for depression: Secondary | ICD-10-CM

## 2024-01-10 MED ORDER — OXCARBAZEPINE 300 MG PO TABS
300.0000 mg | ORAL_TABLET | Freq: Two times a day (BID) | ORAL | 3 refills | Status: DC
  Filled 2024-01-10: qty 180, 90d supply, fill #0

## 2024-01-10 MED ORDER — TOPIRAMATE 200 MG PO TABS
200.0000 mg | ORAL_TABLET | Freq: Two times a day (BID) | ORAL | 3 refills | Status: DC
  Filled 2024-01-10 – 2024-02-24 (×3): qty 180, 90d supply, fill #0

## 2024-01-10 MED ORDER — TETANUS-DIPHTH-ACELL PERTUSSIS 5-2-15.5 LF-MCG/0.5 IM SUSP: 0.5000 mL | Freq: Once | INTRAMUSCULAR | 0 refills | Status: AC

## 2024-01-10 MED ORDER — PNEUMOCOCCAL 20-VAL CONJ VACC 0.5 ML IM SUSY: 0.5000 mL | PREFILLED_SYRINGE | INTRAMUSCULAR | 0 refills | Status: AC
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
## 2024-01-10 NOTE — Patient Instructions (Signed)
 We have referred you to [facility] for your eye exam.  They will call you with the appointment.  I strongly encourage you to discontinue smoking.  Please use the nicotine  patches that you have at home.  I went over with you today how to use the patches.  I have given you a prescription for the pneumonia vaccine and the tetanus vaccine.  You can take those to the pharmacy to get those done at your convenience.

## 2024-01-10 NOTE — Patient Instructions (Signed)
 Good to see you. Wishing you all the best.  Have bloodwork done for CMP  2. Continue Topiramate  200mg  twice a day and Oxcarbazepine  300mg  twice a day.  3. Follow-up in 3-4 months, call for any changes   Seizure Precautions: 1. If medication has been prescribed for you to prevent seizures, take it exactly as directed.  Do not stop taking the medicine without talking to your doctor first, even if you have not had a seizure in a long time.   2. Avoid activities in which a seizure would cause danger to yourself or to others.  Don't operate dangerous machinery, swim alone, or climb in high or dangerous places, such as on ladders, roofs, or girders.  Do not drive unless your doctor says you may.  3. If you have any warning that you may have a seizure, lay down in a safe place where you can't hurt yourself.    4.  No driving for 6 months from last seizure, as per [place]  state law.   Please refer to the following link on the Epilepsy Foundation of America's website for more information: http://www.epilepsyfoundation.org/answerplace/Social/driving/drivingu.cfm   5.  Maintain good sleep hygiene. Avoid alcohol.  6.  Contact your doctor if you have any problems that may be related to the medicine you are taking.  7.  Call 911 and bring the patient back to the ED if:        A.  The seizure lasts longer than 5 minutes.       B.  The patient doesn't awaken shortly after the seizure  C.  The patient has new problems such as difficulty seeing, speaking or moving  D.  The patient was injured during the seizure  E.  The patient has a temperature over 102 F (39C)  F.  The patient vomited and now is having trouble breathing

## 2024-01-11 LAB — COMPREHENSIVE METABOLIC PANEL WITH GFR
AG Ratio: 1.2 (calc) (ref 1.0–2.5)
ALT: 9 U/L (ref 6–29)
AST: 12 U/L (ref 10–35)
Albumin: 4.2 g/dL (ref 3.6–5.1)
Alkaline phosphatase (APISO): 145 U/L (ref 37–153)
BUN: 10 mg/dL (ref 7–25)
CO2: 26 mmol/L (ref 20–32)
Calcium: 9.1 mg/dL (ref 8.6–10.4)
Chloride: 96 mmol/L — ABNORMAL LOW (ref 98–110)
Creat: 0.6 mg/dL (ref 0.50–1.03)
Globulin: 3.5 g/dL (ref 1.9–3.7)
Glucose, Bld: 89 mg/dL (ref 65–99)
Potassium: 3.7 mmol/L (ref 3.5–5.3)
Sodium: 130 mmol/L — ABNORMAL LOW (ref 135–146)
Total Bilirubin: 0.2 mg/dL (ref 0.2–1.2)
Total Protein: 7.7 g/dL (ref 6.1–8.1)
eGFR: 103 mL/min/1.73m2 (ref >=60)

## 2024-01-12 ENCOUNTER — Other Ambulatory Visit: Payer: Self-pay

## 2024-01-12 ENCOUNTER — Emergency Department (HOSPITAL_COMMUNITY): Admission: EM | Admit: 2024-01-12 | Discharge: 2024-01-12 | Disposition: A | Discharge status: Home / Self Care

## 2024-01-12 DIAGNOSIS — E876 Hypokalemia: Secondary | ICD-10-CM | POA: Diagnosis not present

## 2024-01-12 DIAGNOSIS — I10 Essential (primary) hypertension: Secondary | ICD-10-CM | POA: Insufficient documentation

## 2024-01-12 DIAGNOSIS — R569 Unspecified convulsions: Secondary | ICD-10-CM | POA: Diagnosis present

## 2024-01-12 DIAGNOSIS — Z79899 Other long term (current) drug therapy: Secondary | ICD-10-CM | POA: Insufficient documentation

## 2024-01-12 LAB — CBG MONITORING, ED: Glucose-Capillary: 75 mg/dL (ref 70–99)

## 2024-01-12 NOTE — ED Triage Notes (Signed)
 Pt arrives via EMS from her apartment complex community room. Bystanders reported an absence-like seizure, and called EMS. Pt did not fall, no incontinence, no oral injury. Pt states that she takes Oxcarbazepine  and Topiramate , and has not missed any doses. Pt also reports that she was seen by her neurologist yesterday.

## 2024-01-12 NOTE — ED Provider Notes (Signed)
 [place] EMERGENCY DEPARTMENT AT [facility] Provider Note   CSN: [id number] Arrival date & time: 01/12/24  1419     Patient presents with: Seizures   Marie Ferguson is a 58 y.o. female.   57 year old presenting emergency department for evaluation of seizure.  Reports history of the same and out of a are becoming more frequent once a month.  Takes oxcarbazepine  and topiramate  without missing any doses.  Saw her neurologist on Monday and was told everything was fine.  Currently without headache, vision loss, facial droop, unilateral weakness.  No pain no shortness of breath.  States she feels her normal self.  Did not bite her tongue, no incontinence.  She does note that she has been under a lot of stress in her seizures can be triggered by stress   Seizures      Prior to Admission medications   Medication Sig Start Date End Date Taking? Authorizing Provider  amLODipine  (NORVASC ) 10 MG tablet Take 1 tablet (10 mg total) by mouth daily. 11/12/23  Yes Vicci Barnie NOVAK, MD  atorvastatin  (LIPITOR) 20 MG tablet Take 1 tablet (20 mg total) by mouth daily. 11/12/23  Yes Vicci Barnie NOVAK, MD  hydrochlorothiazide  (HYDRODIURIL ) 25 MG tablet Take 1 tablet (25 mg total) by mouth daily. 11/12/23  Yes Vicci Barnie NOVAK, MD  losartan  (COZAAR ) 25 MG tablet Take 1 tablet (25 mg total) by mouth daily. 07/13/23  Yes Vicci Barnie NOVAK, MD  Oxcarbazepine  (TRILEPTAL ) 300 MG tablet Take 1 tablet (300 mg total) by mouth 2 (two) times daily. 01/10/24  Yes Georjean Darice HERO, MD  topiramate  (TOPAMAX ) 200 MG tablet Take 1 tablet (200 mg total) by mouth 2 (two) times daily. 01/10/24  Yes Georjean Darice HERO, MD  acetaminophen  (TYLENOL ) 325 MG tablet Take 650 mg by mouth every 6 (six) hours as needed.    [provider]  diazePAM , 20 MG Dose, (VALTOCO  20 MG DOSE) 2 x 10 MG/0.1ML LQPK Administer one spray in one nostril, another spray in other nostril (one dose) as needed for seizure. May use second dose  after 4 hours if needed Patient not taking: Reported on 01/10/2024 01/05/23   Georjean Darice HERO, MD  nicotine  (NICODERM CQ  - DOSED IN MG/24 HOURS) 14 mg/24hr patch Place 1 patch (14 mg total) onto the skin daily. Patient not taking: Reported on 01/10/2024 11/19/22   Vicci Barnie NOVAK, MD    Allergies: Patient has no known allergies.    Review of Systems  Neurological:  Positive for seizures.    Updated Vital Signs BP 128/76 (BP Location: Left Arm)   Pulse 69   Temp 98.1 F (36.7 C) (Oral)   Resp 16   Wt 88 kg   SpO2 98%   BMI 39.18 kg/m   Physical Exam  (all labs ordered are listed, but only abnormal results are displayed) Labs Reviewed  CBG MONITORING, ED    EKG: EKG Interpretation Date/Time:  Wednesday January 12 2024 15:43:36 EDT Ventricular Rate:  68 PR Interval:  207 QRS Duration:  91 QT Interval:  407 QTC Calculation: 433 R Axis:   28  Text Interpretation: Sinus rhythm Borderline prolonged PR interval Anteroseptal infarct, age indeterminate Confirmed by Neysa Clap [phone]) on 01/12/2024 4:14:16 PM  Radiology: No results found.   Procedures   Medications Ordered in the ED - No data to display  Clinical Course as of 01/12/24 1626  Wed Jan 12, 2024  1617 Per chart review was seen last month in the  ED for similar presentation.  Mildly low potassium sodium at 128.  Per further chart review had repeat labs drawn 2 days ago which showed sodium at 130, does appear she has some history of chronic hyponatremia.  She remains asymptomatic without further episodes or seizures.  Vital signs reassuring.  Will discharge in stable condition. [TY]    Clinical Course User Index [TY] Neysa Caron PARAS, DO                                 Medical Decision Making Negative female presenting emergency department with seizure-like activity.  Afebrile nontachycardic, hemodynamically stable.  Back to baseline with no focal deficits.  Recently saw her neurologist.  Plan to observe in  the emergency department and discharge if no further episodes.  Will check blood sugar and EKG.  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 In May/June 2024, she had episodes of psychosis with significant hyperreligiosity and hypersexual behaviors. Symptoms suggestive of temporal lobe epilepsy. Her EEG showed occasional right temporal slowing, with epileptiform discharges seen over the bifrontal regions exclusively in sleep. MRI brain no acute changes. She had one focal impaired awareness seizure in the past 8 months in the setting of significant stress. Bloodwork showed low sodium of 128, check CMP today. Continue 300mg  BID and Topiramate  200mg  BID for now, we may reduce oxcarbazepine  dose if sodium levels continue to go down. She has prn [facility]alt[facility]  for seizure rescue. She does not drive. Follow-up in 3-4 months, call for any changes.   ECG/medicine tests: ordered.  Risk Decision regarding hospitalization.      Final diagnoses:  Seizure-like activity [facility])    ED Discharge Orders     None          Neysa Caron PARAS, DO 01/12/24 1626

## 2024-01-12 NOTE — Discharge Instructions (Signed)
 Please follow-up with your primary doctor and your neurologist.  Return if no fevers, chills, headache, vision loss, facial droop, uncontrolled seizures or any new or worsening symptoms that are concerning to you.

## 2024-01-12 NOTE — ED Notes (Signed)
 Discharge instructions, medications, and follow up care reviewed with and provided to pt. Pt denies any further questions, and has verbalized understanding.  Daughter Rona to pick up pt.

## 2024-02-03 LAB — OPHTHALMOLOGY REPORT-SCANNED

## 2024-02-24 ENCOUNTER — Other Ambulatory Visit: Payer: Self-pay | Admitting: Internal Medicine

## 2024-02-24 ENCOUNTER — Other Ambulatory Visit: Payer: Self-pay

## 2024-02-25 NOTE — Telephone Encounter (Signed)
 Requested medication (s) are due for refill today: expired medication date   Requested medication (s) are on the active medication list: yes   Last refill:  11/19/22  # 28 2 refills  Future visit scheduled: na   Notes to clinic:  expired medication date. Do you want to renew Rx?     Requested Prescriptions  Pending Prescriptions Disp Refills   nicotine  (NICODERM CQ  - DOSED IN MG/24 HOURS) 14 mg/24hr patch 28 patch 2    Sig: Place 1 patch (14 mg total) onto the skin daily.     Psychiatry:  Drug Dependence Therapy Passed - 02/25/2024  4:18 PM      Passed - Valid encounter within last 12 months    Recent Outpatient Visits           1 month ago Encounter for Medicare annual wellness exam   [facility] [place]. [facility] Vicci Barnie NOVAK, MD   3 months ago Essential hypertension   [facility] Dept Of [place]. [facility] Vicci Barnie NOVAK, MD   7 months ago Essential hypertension   [facility] - [facility] Dept Of [place]. [facility] Vicci Barnie NOVAK, MD   1 year ago Essential hypertension   [facility] [place] [facility] Dept Of [place]. [facility] Brien Belvie BRAVO, MD   1 year ago Essential hypertension   [facility] [place] - [facility] Dept Of [place]. [facility] [place], [place], PA-C

## 2024-02-28 ENCOUNTER — Other Ambulatory Visit: Payer: Self-pay

## 2024-02-28 MED ORDER — NICOTINE 14 MG/24HR TD PT24
14.0000 mg | MEDICATED_PATCH | Freq: Every day | TRANSDERMAL | 2 refills | Status: AC
  Filled 2024-02-28: qty 28, 28d supply, fill #0

## 2024-02-29 ENCOUNTER — Other Ambulatory Visit: Payer: Self-pay

## 2024-04-04 ENCOUNTER — Other Ambulatory Visit: Payer: Self-pay | Admitting: Internal Medicine

## 2024-04-04 ENCOUNTER — Other Ambulatory Visit: Payer: Self-pay

## 2024-04-04 DIAGNOSIS — I10 Essential (primary) hypertension: Secondary | ICD-10-CM

## 2024-04-04 MED ORDER — LOSARTAN POTASSIUM 25 MG PO TABS
25.0000 mg | ORAL_TABLET | Freq: Every day | ORAL | 1 refills | Status: DC
  Filled 2024-04-04: qty 90, 90d supply, fill #0

## 2024-04-05 ENCOUNTER — Ambulatory Visit (INDEPENDENT_AMBULATORY_CARE_PROVIDER_SITE_OTHER): Admitting: Neurology

## 2024-04-05 ENCOUNTER — Other Ambulatory Visit: Payer: Self-pay

## 2024-04-05 ENCOUNTER — Encounter: Payer: Self-pay | Admitting: Neurology

## 2024-04-05 ENCOUNTER — Other Ambulatory Visit

## 2024-04-05 VITALS — BP 130/80 | HR 74 | Resp 20 | Ht 59.0 in | Wt 203.0 lb

## 2024-04-05 DIAGNOSIS — G40009 Localization-related (focal) (partial) idiopathic epilepsy and epileptic syndromes with seizures of localized onset, not intractable, without status epilepticus: Secondary | ICD-10-CM | POA: Diagnosis not present

## 2024-04-05 DIAGNOSIS — E871 Hypo-osmolality and hyponatremia: Secondary | ICD-10-CM | POA: Diagnosis not present

## 2024-04-05 MED ORDER — TOPIRAMATE 200 MG PO TABS
200.0000 mg | ORAL_TABLET | Freq: Two times a day (BID) | ORAL | 3 refills | Status: AC
  Filled 2024-04-05 – 2024-05-25 (×2): qty 180, 90d supply, fill #0

## 2024-04-05 MED ORDER — OXCARBAZEPINE 300 MG PO TABS
300.0000 mg | ORAL_TABLET | Freq: Two times a day (BID) | ORAL | 3 refills | Status: AC
  Filled 2024-04-05: qty 180, 90d supply, fill #0

## 2024-04-05 NOTE — Patient Instructions (Addendum)
 It's always a pleasure to see you.  Have bloodwork done for CMP, Oxcarbazepine  level, Topiramate  level [address]  2. It is recommended to take your medications 10 to 12 hours apart, you can take it at 7am and 7pm, or 9am and 9pm  3. Continue Oxcarbazepine  300mg  twice a day and Topiramate  200mg  twice a day  4. Follow-up in 3 months, call for any changes   Seizure Precautions: 1. If medication has been prescribed for you to prevent seizures, take it exactly as directed.  Do not stop taking the medicine without talking to your doctor first, even if you have not had a seizure in a long time.   2. Avoid activities in which a seizure would cause danger to yourself or to others.  Don't operate dangerous machinery, swim alone, or climb in high or dangerous places, such as on ladders, roofs, or girders.  Do not drive unless your doctor says you may.  3. If you have any warning that you may have a seizure, lay down in a safe place where you can't hurt yourself.    4.  No driving for 6 months from last seizure, as per [place]  state law.   Please refer to the following link on the Epilepsy Foundation of America's website for more information: http://www.epilepsyfoundation.org/answerplace/Social/driving/drivingu.cfm   5.  Maintain good sleep hygiene. Avoid alcohol.  6.  Contact your doctor if you have any problems that may be related to the medicine you are taking.  7.  Call 911 and bring the patient back to the ED if:        A.  The seizure lasts longer than 5 minutes.       B.  The patient doesn't awaken shortly after the seizure  C.  The patient has new problems such as difficulty seeing, speaking or moving  D.  The patient was injured during the seizure  E.  The patient has a temperature over 102 F (39C)  F.  The patient vomited and now is having trouble breathing
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
## 2024-04-08 LAB — OXCARBAZEPINE (TRILEPTAL), SERUM: Oxcarbazepine Metabolite: 11 ug/mL (ref 8.0–35.0)

## 2024-04-08 LAB — COMPLETE METABOLIC PANEL WITHOUT GFR
AG Ratio: 1.3 (calc) (ref 1.0–2.5)
ALT: 11 U/L (ref 6–29)
AST: 19 U/L (ref 10–35)
Albumin: 4.4 g/dL (ref 3.6–5.1)
Alkaline phosphatase (APISO): 156 U/L — ABNORMAL HIGH (ref 37–153)
BUN: 17 mg/dL (ref 7–25)
CO2: 23 mmol/L (ref 20–32)
Calcium: 9.5 mg/dL (ref 8.6–10.4)
Chloride: 92 mmol/L — ABNORMAL LOW (ref 98–110)
Creat: 0.97 mg/dL (ref 0.50–1.03)
Globulin: 3.5 g/dL (ref 1.9–3.7)
Glucose, Bld: 105 mg/dL — ABNORMAL HIGH (ref 65–99)
Potassium: 3.8 mmol/L (ref 3.5–5.3)
Sodium: 127 mmol/L — ABNORMAL LOW (ref 135–146)
Total Bilirubin: 0.6 mg/dL (ref 0.2–1.2)
Total Protein: 7.9 g/dL (ref 6.1–8.1)

## 2024-04-08 LAB — TOPIRAMATE LEVEL: Topiramate Lvl: 11 ug/mL

## 2024-04-12 ENCOUNTER — Other Ambulatory Visit: Payer: Self-pay

## 2024-05-09 ENCOUNTER — Other Ambulatory Visit: Payer: Self-pay

## 2024-05-11 ENCOUNTER — Other Ambulatory Visit: Payer: Self-pay

## 2024-05-11 ENCOUNTER — Ambulatory Visit: Attending: Internal Medicine | Admitting: Internal Medicine

## 2024-05-11 ENCOUNTER — Encounter: Payer: Self-pay | Admitting: Internal Medicine

## 2024-05-11 VITALS — BP 115/72 | HR 79 | Temp 98.3°F | Ht 59.0 in | Wt 201.0 lb

## 2024-05-11 DIAGNOSIS — F17200 Nicotine dependence, unspecified, uncomplicated: Secondary | ICD-10-CM | POA: Diagnosis not present

## 2024-05-11 DIAGNOSIS — E782 Mixed hyperlipidemia: Secondary | ICD-10-CM | POA: Diagnosis not present

## 2024-05-11 DIAGNOSIS — E871 Hypo-osmolality and hyponatremia: Secondary | ICD-10-CM | POA: Diagnosis not present

## 2024-05-11 DIAGNOSIS — I10 Essential (primary) hypertension: Secondary | ICD-10-CM | POA: Diagnosis present

## 2024-05-11 DIAGNOSIS — Z23 Encounter for immunization: Secondary | ICD-10-CM

## 2024-05-11 DIAGNOSIS — F17210 Nicotine dependence, cigarettes, uncomplicated: Secondary | ICD-10-CM | POA: Diagnosis not present

## 2024-05-11 DIAGNOSIS — Z79899 Other long term (current) drug therapy: Secondary | ICD-10-CM | POA: Diagnosis not present

## 2024-05-11 DIAGNOSIS — Z6841 Body Mass Index (BMI) 40.0 and over, adult: Secondary | ICD-10-CM

## 2024-05-11 DIAGNOSIS — H35039 Hypertensive retinopathy, unspecified eye: Secondary | ICD-10-CM | POA: Diagnosis not present

## 2024-05-11 DIAGNOSIS — R7303 Prediabetes: Secondary | ICD-10-CM | POA: Diagnosis not present

## 2024-05-11 DIAGNOSIS — G40009 Localization-related (focal) (partial) idiopathic epilepsy and epileptic syndromes with seizures of localized onset, not intractable, without status epilepticus: Secondary | ICD-10-CM | POA: Diagnosis not present

## 2024-05-11 LAB — POCT GLYCOSYLATED HEMOGLOBIN (HGB A1C): HbA1c, POC (prediabetic range): 5.9 % (ref 5.7–6.4)

## 2024-05-11 LAB — GLUCOSE, POCT (MANUAL RESULT ENTRY): POC Glucose: 103 mg/dL — AB (ref 70–99)

## 2024-05-11 MED ORDER — LOSARTAN POTASSIUM 50 MG PO TABS
50.0000 mg | ORAL_TABLET | Freq: Every day | ORAL | 1 refills | Status: AC
  Filled 2024-05-11: qty 90, 90d supply, fill #0

## 2024-05-11 MED ORDER — ATORVASTATIN CALCIUM 20 MG PO TABS
20.0000 mg | ORAL_TABLET | Freq: Every day | ORAL | 1 refills | Status: AC
  Filled 2024-05-11 – 2024-05-25 (×2): qty 90, 90d supply, fill #0

## 2024-05-11 MED ORDER — AMLODIPINE BESYLATE 10 MG PO TABS
10.0000 mg | ORAL_TABLET | Freq: Every day | ORAL | 1 refills | Status: AC
  Filled 2024-05-11: qty 90, 90d supply, fill #0

## 2024-05-11 NOTE — Progress Notes (Signed)
 "   Patient ID: Marie Ferguson, female    DOB: Dec 04, 1965  MRN: [id number]  CC: Hypertension (HTN & pre-diabetes f/u./Neurologists informed pt low sodium /Flu vax administered on 05/11/24 - C.A.)   Subjective: Marie Ferguson is a 59 y.o. female who presents for chronic ds management. 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 Fate), obesity/preDM, tob dep, focal SZ (Dr. Georjean).   Discussed the use of AI scribe software for clinical note transcription with the patient, who gave verbal consent to proceed.  History of Present Illness Marie Ferguson is a 59 year old female with hypertension, hyperlipidemia, smoking cessation, and seizure disorder who presents for follow-up.  HTN: She manages her hypertension with amlodipine  10 mg daily, hydrochlorothiazide  25 mg daily, and losartan  25 mg daily. She adheres to her medication regimen and monitors her blood pressure at home twice daily, with readings typically around 105-120/70s mmHg. No swelling in the lower legs, shortness of breath, or chest pain. She is actively limiting salt in her diet.  HTN: she continues atorvastatin  20 mg daily. Her last cholesterol check in March of the previous year showed an LDL level of 119 mg/dL.  Tob: She has a history of smoking and currently smokes about three cigarettes a day; still working on trying to quit. She has nicotine  patches but uses them inconsistently.   Focal seizure disorder: she is under the care of a neurologist and takes Topamax  200 mg twice daily and oxcarbazepine  300 mg twice daily. Diazepam  is prescribed as needed but has not been used recently. Last seen in ER 01/2024 for sz. Her sodium levels have been low, with the most recent level at 127 mmol/L, and she drinks a lot of fluids daily.  PreDM/Obesity:  Results for orders placed or performed in visit on 05/11/24  POCT glucose (manual entry)   Collection Time: 05/11/24 11:28 AM  Result  Value Ref Range   POC Glucose 103 (A) 70 - 99 mg/dl  POCT glycosylated hemoglobin (Hb A1C)   Collection Time: 05/11/24 11:54 AM  Result Value Ref Range   Hemoglobin A1C     HbA1c POC (<> result, manual entry)     HbA1c, POC (prediabetic range) 5.9 5.7 - 6.4 %   HbA1c, POC (controlled diabetic range)    She is being monitored for prediabetes and weight management. Her weight has increased from 194 pounds in September to 201 pounds currently. She acknowledges overindulgence during the holidays. She does not currently engage in regular physical activity but intends to resume walking.    Patient Active Problem List   Diagnosis Date Noted   Morbid (severe) obesity due to excess calories (HCC) 05/11/2024   Seizure disorder (HCC) 08/11/2021   Prediabetes 07/30/2019   Intraductal papilloma of left breast 06/22/2019   Screening breast examination 12/29/2018   Hyperlipidemia, mixed 12/08/2018   Hypertensive retinopathy of both eyes 11/07/2018   Essential hypertension 11/07/2018   Primary optic atrophy of right eye 11/07/2018   Tobacco dependence 11/07/2018   Obesity (BMI 30-39.9) 11/07/2018   Breast mass in female 11/07/2018     Medications Ordered Prior to Encounter[1]  Allergies[2]  Social History   Socioeconomic History   Marital status: Single    Spouse name: Not on file   Number of children: 3   Years of education: 1 yr community college   Highest education level: Some college, no degree  Occupational History   Occupation: Occupational Hygienist  Tobacco  Use   Smoking status: Every Day    Current packs/day: 0.50    Average packs/day: 0.5 packs/day for 1 year (0.5 ttl pk-yrs)    Types: Cigarettes    Start date: 2025    Last attempt to quit: 03/2023   Smokeless tobacco: Never   Tobacco comments:    5 cigs/ day  Vaping Use   Vaping status: Never Used  Substance and Sexual Activity   Alcohol use: Not Currently   Drug use: Not Currently    Comment: 1x a week   Sexual activity:  Not Currently    Comment: single not partners currently  Other Topics Concern   Not on file  Social History Narrative   Right handed    [place]    Social Drivers of Health   Tobacco Use: High Risk (04/05/2024)   Patient History    Smoking Tobacco Use: Every Day    Smokeless Tobacco Use: Never    Passive Exposure: Not on file  Financial Resource Strain: Low Risk (01/10/2024)   Overall Financial Resource Strain (CARDIA)    Difficulty of Paying Living Expenses: Not hard at all  Food Insecurity: No Food Insecurity (01/10/2024)   Epic    Worried About Radiation Protection Practitioner of Food in the Last Year: Never true    Ran Out of Food in the Last Year: Never true  Transportation Needs: No Transportation Needs (11/12/2023)   Epic    Lack of Transportation (Medical): No    Lack of Transportation (Non-Medical): No  Physical Activity: Insufficiently Active (01/10/2024)   Exercise Vital Sign    Days of Exercise per Week: 2 days    Minutes of Exercise per Session: 20 min  Stress: Stress Concern Present (01/10/2024)   [employer] of Occupational Health - Occupational Stress Questionnaire    Feeling of Stress: Very much  Social Connections: Socially Isolated (01/10/2024)   Social Connection and Isolation Panel    Frequency of Communication with Friends and Family: Three times a week    Frequency of Social Gatherings with Friends and Family: Three times a week    Attends Religious Services: Never    Active Member of Clubs or Organizations: No    Attends Banker Meetings: Never    Marital Status: Never married  Intimate Partner Violence: Not At Risk (01/10/2024)   Epic    Fear of Current or Ex-Partner: No    Emotionally Abused: No    Physically Abused: No    Sexually Abused: No  Depression (PHQ2-9): Medium Risk (01/10/2024)   Depression (PHQ2-9)    PHQ-2 Score: 6  Alcohol Screen: Low Risk (11/12/2023)   Alcohol Screen    Last Alcohol Screening Score (AUDIT): 0  Housing:  Unknown (01/10/2024)   Epic    Unable to Pay for Housing in the Last Year: No    Number of Times Moved in the Last Year: Not on file    Homeless in the Last Year: No  Utilities: Not At Risk (01/10/2024)   Epic    Threatened with loss of utilities: No  Health Literacy: Adequate Health Literacy (01/10/2024)   B1300 Health Literacy    Frequency of need for help with medical instructions: Never    Family History  Problem Relation Age of Onset   Hypertension Mother    Hypertension Father    Diabetes Maternal Aunt        x2   Diabetes Maternal Uncle    Heart attack Brother  BRCA 1/2 Neg Hx    Breast cancer Neg Hx     Past Surgical History:  Procedure Laterality Date   ABDOMINAL HYSTERECTOMY     BREAST LUMPECTOMY WITH RADIOACTIVE SEED LOCALIZATION Left 03/03/2019   Procedure: LEFT BREAST LUMPECTOMY X 2 WITH 2 RADIOACTIVE SEED LOCALIZATION;  Surgeon: Curvin Deward MOULD, MD;  Location: [facility];  Service: General;  Laterality: Left;   CESAREAN SECTION     x 3   COLONOSCOPY  07/21/2019   POLYPECTOMY  07/21/2019    ROS: Review of Systems Negative except as stated above  PHYSICAL EXAM: BP 115/72 (BP Location: Left Arm, Patient Position: Sitting, Cuff Size: Normal)   Pulse 79   Temp 98.3 F (36.8 C) (Oral)   Ht 4' 11 (1.499 m)   Wt 201 lb (91.2 kg)   SpO2 98%   BMI 40.60 kg/m   Wt Readings from Last 3 Encounters:  05/11/24 201 lb (91.2 kg)  04/05/24 203 lb (92.1 kg)  01/12/24 194 lb 0.1 oz (88 kg)    Physical Exam General appearance - alert, well appearing, older AAF and in no distress Mental status - normal mood, behavior, speech, dress, motor activity, and thought processes Chest - clear to auscultation, no wheezes, rales or rhonchi, symmetric air entry Heart - normal rate, regular rhythm, normal S1, S2, no murmurs, rubs, clicks or gallops Extremities - peripheral pulses normal, no pedal edema, no clubbing or cyanosis      Latest Ref Rng & Units  04/05/2024    1:46 PM 01/10/2024    9:25 AM 12/17/2023   11:24 AM  CMP  Glucose 65 - 99 mg/dL 894  89  94   BUN 7 - 25 mg/dL 17  10  19    Creatinine 0.50 - 1.03 mg/dL 9.02  9.39  9.36   Sodium 135 - 146 mmol/L 127  130  128   Potassium 3.5 - 5.3 mmol/L 3.8  3.7  3.3   Chloride 98 - 110 mmol/L 92  96  94   CO2 20 - 32 mmol/L 23  26  25    Calcium  8.6 - 10.4 mg/dL 9.5  9.1  9.1   Total Protein 6.1 - 8.1 g/dL 7.9  7.7  7.9   Total Bilirubin 0.2 - 1.2 mg/dL 0.6  0.2  0.3   Alkaline Phos 38 - 126 U/L   147   AST 10 - 35 U/L 19  12  14    ALT 6 - 29 U/L 11  9  10     Lipid Panel     Component Value Date/Time   CHOL 191 07/13/2023 0949   TRIG 100 07/13/2023 0949   HDL 54 07/13/2023 0949   CHOLHDL 3.5 07/13/2023 0949   LDLCALC 119 (H) 07/13/2023 0949    CBC    Component Value Date/Time   WBC 3.7 (L) 12/17/2023 1124   RBC 4.19 12/17/2023 1124   HGB 11.6 (L) 12/17/2023 1124   HGB 13.0 02/11/2021 1108   HCT 35.4 (L) 12/17/2023 1124   HCT 38.1 02/11/2021 1108   PLT 335 12/17/2023 1124   PLT 382 02/11/2021 1108   MCV 84.5 12/17/2023 1124   MCV 86 02/11/2021 1108   MCH 27.7 12/17/2023 1124   MCHC 32.8 12/17/2023 1124   RDW 13.7 12/17/2023 1124   RDW 12.8 02/11/2021 1108   LYMPHSABS 1.2 12/17/2023 1124   LYMPHSABS 1.8 02/11/2021 1108   MONOABS 0.4 12/17/2023 1124   EOSABS 0.1 12/17/2023 1124  EOSABS 0.1 02/11/2021 1108   BASOSABS 0.0 12/17/2023 1124   BASOSABS 0.0 02/11/2021 1108    ASSESSMENT AND PLAN: 1. Essential hypertension (Primary) At goal.  Continue amlodipine .  We will discontinue hydrochlorothiazide  to see whether this is causing the low sodium.  Increase Cozaar  to 50 mg instead. - amLODipine  (NORVASC ) 10 MG tablet; Take 1 tablet (10 mg total) by mouth daily.  Dispense: 90 tablet; Refill: 1 - losartan  (COZAAR ) 50 MG tablet; Take 1 tablet (50 mg total) by mouth daily.  Dispense: 90 tablet; Refill: 1 - Basic Metabolic Panel; Future  2. Hyperlipidemia, mixed Continue  atorvastatin . - atorvastatin  (LIPITOR) 20 MG tablet; Take 1 tablet (20 mg total) by mouth daily.  Dispense: 90 tablet; Refill: 1 - Lipid panel; Future  3. Tobacco dependence Strongly encouraged her to try to quit.  Commended her on her efforts so far.  Encouraged her to use the patches consistently and to set a quit date.  4. Prediabetes See #5 below - POCT glucose (manual entry) - POCT glycosylated hemoglobin (Hb A1C)  5. Morbid (severe) obesity due to excess calories [facility]) Encouraged her to get back on track with her eating habits. Patient advised to eliminate sugary drinks from the diet, cut back on portion sizes especially of white carbohydrates, eat more white lean meat like chicken turkey and seafood instead of beef or pork and incorporate fresh fruits and vegetables into the diet daily. - Encouraged her to try to move as much as she can.  6. Localization-related idiopathic epilepsy and epileptic syndromes with seizures of localized onset, not intractable, without status epilepticus (HCC) Followed by Dr. Georjean.  She is compliant with Trileptal  as prescribed by the neurologist.  7. Hyponatremia Could be due to Trileptal  or HCTZ or combination of both.  She appears euvolemic on exam today.  We will try stopping the HCTZ and have her return to the lab in 1 week for recheck of BMP  8. Need for immunization against influenza - Flu vaccine trivalent PF, 6mos and older(Flulaval,Afluria,Fluarix,Fluzone)   Patient was given the opportunity to ask questions.  Patient verbalized understanding of the plan and was able to repeat key elements of the plan.   This documentation was completed using Paediatric nurse.  Any transcriptional errors are unintentional.  Orders Placed This Encounter  Procedures   Flu vaccine trivalent PF, 6mos and older(Flulaval,Afluria,Fluarix,Fluzone)   Lipid panel   Basic Metabolic Panel   POCT glucose (manual entry)   POCT glycosylated  hemoglobin (Hb A1C)     Requested Prescriptions   Signed Prescriptions Disp Refills   amLODipine  (NORVASC ) 10 MG tablet 90 tablet 1    Sig: Take 1 tablet (10 mg total) by mouth daily.   atorvastatin  (LIPITOR) 20 MG tablet 90 tablet 1    Sig: Take 1 tablet (20 mg total) by mouth daily.   losartan  (COZAAR ) 50 MG tablet 90 tablet 1    Sig: Take 1 tablet (50 mg total) by mouth daily.    Return in about 4 months (around 09/08/2024) for Lab visit in 1 wk.  Barnie Louder, MD, FACP     [1]  Current Outpatient Medications on File Prior to Visit  Medication Sig Dispense Refill   acetaminophen  (TYLENOL ) 325 MG tablet Take 650 mg by mouth every 6 (six) hours as needed.     diazePAM , 20 MG Dose, (VALTOCO  20 MG DOSE) 2 x 10 MG/0.1ML LQPK Administer one spray in one nostril, another spray in other nostril (one dose)  as needed for seizure. May use second dose after 4 hours if needed 5 each 5   nicotine  (NICODERM CQ  - DOSED IN MG/24 HOURS) 14 mg/24hr patch Place 1 patch (14 mg total) onto the skin daily. 28 patch 2   Oxcarbazepine  (TRILEPTAL ) 300 MG tablet Take 1 tablet (300 mg total) by mouth 2 (two) times daily. 180 tablet 3   topiramate  (TOPAMAX ) 200 MG tablet Take 1 tablet (200 mg total) by mouth 2 (two) times daily. 180 tablet 3   No current facility-administered medications on file prior to visit.  [2] No Known Allergies  "

## 2024-05-11 NOTE — Patient Instructions (Signed)
" °  VISIT SUMMARY: Today, you came in for a follow-up visit to manage your hypertension, hyperlipidemia, smoking cessation, seizure disorder, prediabetes, and weight management. We reviewed your current medications, discussed your smoking cessation efforts, and addressed your recent weight gain.  YOUR PLAN: -ESSENTIAL HYPERTENSION: Your blood pressure is well-controlled with your current medications. Continue taking amlodipine  10 mg daily and losartan  25 mg daily. Keep limiting salt in your diet.  -MIXED HYPERLIPIDEMIA: Your cholesterol levels were previously above target. We ordered a lipid panel to reassess your cholesterol levels. Continue taking atorvastatin  20 mg daily.  -NICOTINE  DEPENDENCE: You are currently smoking three cigarettes a day and using nicotine  patches inconsistently. We encourage you to use the nicotine  patches consistently when you are ready to quit smoking.  -LOCALIZATION-RELATED EPILEPSY: You have not had any recent seizures. Continue taking Topamax  200 mg twice daily and oxcarbazepine  300 mg twice daily. Use diazepam  as needed.  -HYPONATREMIA: Your sodium levels are low, which may be due to your medications. We have discontinued hydrochlorothiazide  and increased your losartan  to 50 mg daily. Follow-up lab tests are scheduled in one week to reassess your sodium levels.  -PREDIABETES: Your blood glucose levels are in the prediabetic range, and you have gained 7 pounds. We encourage you to make dietary changes to reduce sugary drinks and snacks and to increase your physical activity by walking three times a week for 30 minutes.  -MORBID OBESITY: You have recently gained weight due to holiday eating. We encourage you to make dietary changes to reduce your caloric intake and to increase your physical activity by walking three times a week for 30 minutes.  -GENERAL HEALTH MAINTENANCE: You were due for a flu vaccination, which was administered today.  INSTRUCTIONS: Please follow  up with lab tests in one week to reassess your sodium levels. Continue monitoring your blood pressure at home and keep track of your cholesterol levels. Schedule a follow-up appointment to discuss your progress with smoking cessation, weight management, and any other concerns.                      Contains text generated by [employer].                                 Contains text generated by [employer]bridge.   "

## 2024-05-18 ENCOUNTER — Other Ambulatory Visit: Payer: Self-pay

## 2024-05-18 ENCOUNTER — Ambulatory Visit: Payer: Self-pay | Attending: Family Medicine

## 2024-05-18 DIAGNOSIS — I10 Essential (primary) hypertension: Secondary | ICD-10-CM

## 2024-05-18 DIAGNOSIS — E782 Mixed hyperlipidemia: Secondary | ICD-10-CM

## 2024-05-19 ENCOUNTER — Ambulatory Visit: Payer: Self-pay | Admitting: Internal Medicine

## 2024-05-19 LAB — BASIC METABOLIC PANEL WITH GFR
BUN/Creatinine Ratio: 13 (ref 9–23)
BUN: 9 mg/dL (ref 6–24)
CO2: 20 mmol/L (ref 20–29)
Calcium: 9 mg/dL (ref 8.7–10.2)
Chloride: 105 mmol/L (ref 96–106)
Creatinine, Ser: 0.68 mg/dL (ref 0.57–1.00)
Glucose: 90 mg/dL (ref 70–99)
Potassium: 4.1 mmol/L (ref 3.5–5.2)
Sodium: 137 mmol/L (ref 134–144)
eGFR: 100 mL/min/1.73

## 2024-05-19 LAB — LIPID PANEL
Chol/HDL Ratio: 3.2 ratio (ref 0.0–4.4)
Cholesterol, Total: 155 mg/dL (ref 100–199)
HDL: 49 mg/dL
LDL Chol Calc (NIH): 88 mg/dL (ref 0–99)
Triglycerides: 96 mg/dL (ref 0–149)
VLDL Cholesterol Cal: 18 mg/dL (ref 5–40)

## 2024-05-25 ENCOUNTER — Other Ambulatory Visit: Payer: Self-pay

## 2024-05-25 ENCOUNTER — Other Ambulatory Visit (HOSPITAL_COMMUNITY): Payer: Self-pay

## 2024-05-26 ENCOUNTER — Other Ambulatory Visit (HOSPITAL_COMMUNITY): Payer: Self-pay

## 2024-08-15 ENCOUNTER — Ambulatory Visit: Admitting: Neurology

## 2024-09-11 ENCOUNTER — Ambulatory Visit: Payer: Self-pay | Admitting: Internal Medicine
